# Patient Record
Sex: Male | Born: 1935 | Race: White | Hispanic: No | Marital: Married | Smoking: Never smoker
Health system: Southern US, Community
[De-identification: ages and names within clinical notes are randomized; demographics above are authoritative.]

## PROBLEM LIST (undated history)

## (undated) DIAGNOSIS — M1990 Unspecified osteoarthritis, unspecified site: Secondary | ICD-10-CM

## (undated) DIAGNOSIS — F0390 Unspecified dementia without behavioral disturbance: Secondary | ICD-10-CM

## (undated) DIAGNOSIS — F329 Major depressive disorder, single episode, unspecified: Secondary | ICD-10-CM

## (undated) DIAGNOSIS — F32A Depression, unspecified: Secondary | ICD-10-CM

## (undated) DIAGNOSIS — I10 Essential (primary) hypertension: Secondary | ICD-10-CM

## (undated) DIAGNOSIS — E785 Hyperlipidemia, unspecified: Secondary | ICD-10-CM

## (undated) DIAGNOSIS — M4800 Spinal stenosis, site unspecified: Secondary | ICD-10-CM

## (undated) HISTORY — PX: BACK SURGERY: SHX140

## (undated) HISTORY — PX: OTHER SURGICAL HISTORY: SHX169

---

## 2000-08-13 ENCOUNTER — Ambulatory Visit (HOSPITAL_COMMUNITY): Admission: RE | Admit: 2000-08-13 | Discharge: 2000-08-13 | Payer: Self-pay | Admitting: Family Medicine

## 2000-08-13 ENCOUNTER — Encounter: Payer: Self-pay | Admitting: Family Medicine

## 2000-11-24 ENCOUNTER — Encounter: Payer: Self-pay | Admitting: Neurosurgery

## 2000-11-24 ENCOUNTER — Ambulatory Visit: Admission: RE | Admit: 2000-11-24 | Discharge: 2000-11-25 | Payer: Self-pay | Admitting: Neurosurgery

## 2000-12-28 ENCOUNTER — Encounter: Payer: Self-pay | Admitting: Neurosurgery

## 2000-12-28 ENCOUNTER — Inpatient Hospital Stay (HOSPITAL_COMMUNITY): Admission: RE | Admit: 2000-12-28 | Discharge: 2000-12-31 | Payer: Self-pay | Admitting: Neurosurgery

## 2002-03-21 ENCOUNTER — Ambulatory Visit (HOSPITAL_COMMUNITY): Admission: RE | Admit: 2002-03-21 | Discharge: 2002-03-21 | Payer: Self-pay | Admitting: Gastroenterology

## 2002-03-21 ENCOUNTER — Encounter (INDEPENDENT_AMBULATORY_CARE_PROVIDER_SITE_OTHER): Payer: Self-pay | Admitting: Specialist

## 2004-09-17 ENCOUNTER — Ambulatory Visit: Payer: Self-pay | Admitting: Family Medicine

## 2004-12-23 ENCOUNTER — Ambulatory Visit: Payer: Self-pay | Admitting: Family Medicine

## 2005-04-28 ENCOUNTER — Ambulatory Visit: Payer: Self-pay | Admitting: Family Medicine

## 2005-09-01 ENCOUNTER — Ambulatory Visit: Payer: Self-pay | Admitting: Family Medicine

## 2005-12-31 ENCOUNTER — Ambulatory Visit: Payer: Self-pay | Admitting: Family Medicine

## 2006-05-10 ENCOUNTER — Ambulatory Visit: Payer: Self-pay | Admitting: Family Medicine

## 2007-01-19 ENCOUNTER — Ambulatory Visit: Payer: Self-pay | Admitting: Family Medicine

## 2007-12-02 ENCOUNTER — Ambulatory Visit (HOSPITAL_COMMUNITY): Admission: RE | Admit: 2007-12-02 | Discharge: 2007-12-02 | Payer: Self-pay | Admitting: Family Medicine

## 2009-09-12 ENCOUNTER — Encounter: Payer: Self-pay | Admitting: Emergency Medicine

## 2009-09-12 ENCOUNTER — Emergency Department (HOSPITAL_COMMUNITY): Admission: EM | Admit: 2009-09-12 | Discharge: 2009-09-12 | Payer: Self-pay | Admitting: Emergency Medicine

## 2009-09-12 ENCOUNTER — Ambulatory Visit: Payer: Self-pay | Admitting: Radiology

## 2011-01-30 NOTE — H&P (Signed)
[place]. [facility]  Patient:    Joshua Bright, Joshua Bright                    MRN: [id number] Adm. Date:  [id number] Attending:  Josie Saunders                         History and Physical  REASON FOR ADMISSION:  Lumbar spinal stenosis.  HISTORY OF ILLNESS:  Joshua Bright is a 77 year old right-handed former Programmer, systems and business person who presented at the request of Dr. Christell Constant for neurosurgical consultation regarding lumbar spinal stenosis.  I initially saw him on September 29, 2000.  Joshua Bright complains currently of hip pain into his right hip.  He also notes that he has lost significant muscle into his calf on the right.  He says he can only walk for up to a quarter of a mile and then he develops severe hip pain and his legs go out on him.  He says that this occurs when he stands for 10 to 15 minutes at a time.  He notes tightness in his left leg and says this has been going on for the last month.  He says he has pain in his right leg and atrophy of his calf over the last five to six years.  He denies significant low back pain.  He denies bowel or bladder dysfunction other than increased frequency of urination and he does note difficulty having and maintaining erections.  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  He retired in 1985.  He says he was extremely active and he exercises daily, seven days a week; he rides a bicycle and cuts wood.  He says he had a stress test 10 years ago and that several years ago, he had his cholesterol checked and this was okay.  He usually does not see a doctor.  He did see Dr. Julieanne Manson at the time of his stress test.  He attributes his health to mainly a vegetarian diet with plenty of exercise.  He notes that he was hit by a falling tree in 1980; it cracked a vertebra in his upper back. Other than that, he has had no problems with his back.  Joshua Bright presents with an MRI  of the lumbar spine which was obtained on August 13, 2000 which shows critical spinal stenosis at L4-5 secondary to posterior element hypertrophy and central herniated disk.  The thecal sac could not be observed due to severe compression.  There is similar but not quite as severe stenosis at L2-3 secondary to posterior element hypertrophy and central herniated disk.  There is similar but less severe stenosis at L3-4 and a central herniated disk at L5-S1 eccentric to the left.  REVIEW OF SYSTEMS:  Detailed review of systems sheet was reviewed with the patient.  Pertinent positives include:  EYES:  He wears glasses.  EARS, NOSE, MOUTH AND THROAT:  He notes hearing loss.  CARDIOVASCULAR:  He notes high blood pressure.  MUSCULOSKELETAL:  He notes back pain and leg pain.  All other systems negative.  PAST MEDICAL HISTORY:  Current medical conditions:  He notes high blood pressure, otherwise, no significant problems.  Prior operations and hospitalizations:  He said he was treated for a cracked vertebra in the 1970s when he was hit by a tree and hospitalized for eight days.  He had nasal polyps removed in the  early 52s.  He had a biopsy of his prostate gland by Dr. Maretta Bees. Joshua Bright which was negative.  CURRENT MEDICATIONS: 1. Zestril 20 mg daily. 2. Triamterene/hydrochlorothiazide 37.5/25 mg once daily.  ALLERGIES:  He denies any drug allergies.  HEIGHT AND WEIGHT:  Joshua Bright is 5-feet 8-inches tall, 160 pounds.  FAMILY HISTORY:  Mother died at age 86.  Father died at age 53 of heart disease.  SOCIAL HISTORY:  He is a nonsmoker, nondrinker of alcoholic beverages.  There is no history of substance abuse.  DIAGNOSTIC STUDIES:  As above.  PHYSICAL EXAMINATION:  GENERAL APPEARANCE:  On examination today, Joshua Bright is a pleasant, cooperative, fit-appearing older white male in no acute distress.  VITAL SIGNS:  Blood pressure and pulse 130/80, left arm seated, pulse is 80 and  irregular with frequent PVCs.  HEENT:  Normocephalic, atraumatic.  The pupils are equal, round and reactive to light.  Extraocular movements are intact.  Sclerae are white.  Conjunctivae pink.  Oropharynx benign.  Uvula midline.  NECK:  There are no masses, meningismus, deformities, tracheal deviation, jugular venous distention or carotid bruits.  There is normal cervical range of motion.  Spurlings test is negative without reproducible radicular pain turning the patients head to either side.  Lhermittes sign is not present with axial compression.  RESPIRATORY:  There is normal respiratory effort with good intercostal function.  Lungs are clear to auscultation.  There are no rales, rhonchi or wheezes.  CARDIOVASCULAR:  The heart has a regular rate and rhythm to auscultation.  No murmurs are appreciated.  There is no extremity edema, clubbing or cyanosis. There are palpable pedal pulses.  He does have frequent PVCs noted on his examination.  ABDOMEN:  Abdomen soft, nontender.  No hepatosplenomegaly appreciated or masses.  There are active bowel sounds.  No guarding or rebound.  MUSCULOSKELETAL:  He limps on the right because of his calf atrophy on the right.  He is able to bend to within four inches of the floor.  He cannot stand on his toes on the right but he can stand on his heels in both feet.  He has significant right-sided sciatic notch discomfort to palpation.  Straight leg raise is negative.  NEUROLOGIC:  The patient is oriented to time, person and place and has good recall of both recent and remote memory with normal attention span and concentration.  The patients speaks with clear and fluent speech and exhibits normal language function and appropriate fund of knowledge.  Cranial nerve examination:  Pupils are equal, round and reactive to light.  Extraocular movements are full.  Visual fields are full to confrontational testing. Facial sensation and facial motor are intact  and symmetric.  Hearing is intact to finger rub.  Palate is upgoing.  Shoulder shrug is symmetric.  Tongue  protrudes in the midline.  Motor examination:  Motor strength is 5/5 in bilateral deltoids, biceps, triceps, hand grips, wrist extensors and interossei.  In the lower extremities, motor strength is 5/5 in hip flexion, extension, quadriceps, hamstrings, dorsiflexion, extensor hallucis longi and left plantarflexion; he has marked weakness in his right plantarflexion. Sensory examination normal to light touch and pinprick sensation in the upper and lower extremities.  Deep tendon reflexes are 1 in the biceps, triceps and brachioradialis; 2 at the right knee; 3 at the left knee; absent at both ankles.  Great toes are downgoing to plantar stimulation.  He has a positive Hoffmanns sign on the right and plus/minus on the  left.  Cerebellar examination:  Normal coordination in the upper and lower extremities and normal rapid alternating movements.  Rombergs test is negative.  IMPRESSION AND RECOMMENDATIONS:  Joshua Bright is a 77 year old man with significant right calf atrophy and marked spinal stenosis most marked at the L4-5 level, but to a lesser degree at L2-3 and L3-4 levels.  I recommended that he undergo decompressive lumbar laminectomy for spinal stenosis.  This will be performed at L2 through L5 levels but I was concerned about the atrophy of his gastrocnemius and told him I did not think this would come back.  He understands that the purpose of the surgery will be to relieve pain and also to prevent further decline in function, but he may not get his strength back.  I was concerned on this examination of frequent premature ventricular contractions and wanted to make sure there is not a problem before embarking on an elective surgical procedure.  He is going to go back to see Dr. Clarene Duke who he previously saw and upon clear etiology and clearance, we will go ahead and set him up  for surgery.  I reviewed the studies with the patient and went over his physical examination.  I reviewed surgical models and discussed typical hospital course and operative and postoperative course and the potential risks and benefits of surgery.  The risks of surgery were discussed in detail and included, but are not limited to, the risks of anesthesia, blood loss, the possible of hemorrhage, infection, damage to nerves, damage to blood vessels, injury to the lumbar nerve root causing either temporary or permanent leg pain, numbness or weakness.  There is a potential for a spinal fluid leak from dural tear. There is a potential for post-laminectomy spondylolisthesis, recurrent disk herniation quoted at approximately 10%, failure to relieve pain, worsening of pain and need for further surgery.DD:  12/28/00 TD:  12/28/00 Job: [id number]

## 2011-01-30 NOTE — Op Note (Signed)
[place]. [facility]  Patient:    Joshua Bright, Joshua Bright                    MRN: [id number] Proc. Date: 12/28/00 Adm. Date:  [id number] Attending:  Josie Saunders                           Operative Report  PREOPERATIVE DIAGNOSIS:  Severe spinal stenosis L2-3, L3-4, L4-5, and L5-S1, with spondylosis, degenerative disk disease, and lumbar radiculopathy.  POSTOPERATIVE DIAGNOSIS:  Severe spinal stenosis L2-3, L3-4, L4-5, and L5-S1, with spondylosis, degenerative disk disease, and lumbar radiculopathy.  PROCEDURE:  Decompressive lumbar laminectomy L2 through S1 with microdissection.  SURGEON:  Danae Orleans. Venetia Maxon, M.D.  ASSISTANT:  Tanya Nones. Jeral Fruit, M.D.  ANESTHESIA:  General endotracheal anesthesia.  ESTIMATED BLOOD LOSS:  300 cc.  COMPLICATIONS:  Small durotomy, repaired primarily.  DISPOSITION:  To recovery.  INDICATION:  Joshua Bright is a 77 year old man with marked spinal stenosis and critical stenosis at the L4-5 level and severe stenosis at L2-3, L3-4, and the L5-S1 level.  It was elected to take him to surgery for decompressive lumbar laminectomy.  DESCRIPTION OF PROCEDURE:  Joshua Bright was brought to the operating room. Following the satisfactory and uncomplicated induction of general endotracheal anesthesia and placement of intravenous lines and Foley catheter, he was placed in a prone position on the Wilson frame.  His low back was then prepped and draped in the usual sterile fashion.  The area of planned incision was infiltrated with 0.25% Marcaine and 0.5% lidocaine with 1:200,000 epinephrine. Incision was made in the midline overlying the spinous processes from the L2 to sacral levels, carried sharply through subcutaneous fat to the lumbodorsal fascia, which was then incised with electrocautery.  Subperiosteal dissection was performed, exposing the L2 through sacral laminae bilaterally. Self-retaining _____ retractor was placed.   Intraoperative x-ray confirmed orientation.  The spinous processes of L2, L3, L4, and L5 were removed.  The laminae of L2 through L5 were then thinned with Leksell rongeur.  The bone was quite soft.  The spinal canal was then decompressed with a variety of Kerrison rongeurs.  The dura was quite thin and adherent to the hypertrophied ligamentum flavum.  This was particularly marked at the L4-5 level, where there was near-complete block of the spinal canal.  On decompressing the spinal canal on the right side at the L4-5 level, a small midline durotomy was created.  This was subsequently repaired under the microscope with interrupted 6-0 Prolene sutures with no evidence of residual egress of CSF.  Both the lateral recesses were decompressed from L2 to the sacral levels bilaterally. There was significant hypertrophy of the ligamentum flavum, and this was quite densely adherent to the dura and the lateral recesses.  No other areas of durotomy were created.  Following completion of decompression, there was excellent hemostasis.  The [facility] elevator was easily passed in the neural foramina at each of the decompressed levels.  The Tisseel was then applied to the area of dural opening.  The self-retaining retractor was removed.  The deep muscular layers were reapproximated with 1 Vicryl sutures, the lumbodorsal fascia was reapproximated with 1 Vicryl sutures, subcutaneous tissue was reapproximated with 2-0 Vicryl interrupted inverted sutures, and the skin edges were reapproximated with a running 3-0 nylon stitch.  The wound was dressed with bacitracin and Telfa gauze tape.  The patient was extubated in the operating room, having  tolerated the operation well without difficulty. DD:  12/28/00 TD:  12/29/00 Job: [id number]

## 2011-01-30 NOTE — Procedures (Signed)
[facility]  Patient:    Joshua Bright, Joshua Bright Visit Number: [id number] MRN: [id number]          Service Type: END Location: ENDO Attending Physician:  Louie Bun Dictated by:   Everardo All Madilyn Fireman, M.D. Proc. Date: 03/21/02 Admit Date:  03/21/2002 Discharge Date: 03/21/2002   CC:         Delaney Meigs, M.D.   Procedure Report  PROCEDURE:  Colonoscopy with polypectomy.  INDICATION FOR PROCEDURE:  Intermittent rectal bleeding and a family history of colon cancer in a first degree relative.  DESCRIPTION OF PROCEDURE:  The patient was placed in the left lateral decubitus position then placed on the pulse monitor with continuous low flow oxygen delivered by nasal cannula. He was sedated with 70 mg IV Demerol and 5 mg IV Versed. The Olympus video colonoscope was inserted into the rectum and advanced to the cecum, confirmed by transillumination at McBurneys point and visualization of the ileocecal valve and appendiceal orifice. The prep was good. The cecum and ascending colon appeared normal with no polyps, diverticula or other mucosal abnormalities. Within the transverse colon, there was an 8 mm polyp which was fulgurated by hot biopsy. The transverse colon appeared normal. In the sigmoid colon, there were multiple diverticula. The rectum appeared normal and retroflexed view of the anus revealed no obvious internal hemorrhoids. The colonoscope was then withdrawn and the patient returned to the recovery room in stable condition. The patient tolerated the procedure well and there were no immediate complications.  IMPRESSION: 1. An 8 mm transverse colon polyp hot biopsied. 2. Diverticulosis.  PLAN:  Await histology for determination of the method and interval for next colonoscopy. Dictated by:   Everardo All Madilyn Fireman, M.D. Attending Physician:  Louie Bun DD:  03/21/02 TD:  03/24/02 Job: [id number]

## 2011-01-30 NOTE — Discharge Summary (Signed)
[place]. [facility]  Patient:    Joshua Bright, Joshua Bright                    MRN: [id number] Adm. Date:  [id number] Disc. Date: [id number] Attending:  Josie Saunders                           Discharge Summary  ADMISSION DIAGNOSIS:  Lumbosacral spondylosis with degenerative disc disease and spinal stenosis.  DISCHARGE DIAGNOSES: 1. Lumbosacral spondylosis with degenerative disc disease and spinal stenosis. 2. Urinary retention. 3. Premature ventricular contractions. 4. Hypertension. 5. Old myocardial infarction.  HISTORY OF PRESENT ILLNESS:  Joshua Bright is a 77 year old man with a severe spinal stenosis.  He additionally has hypertension for which he takes Zestril 20 mg daily and triamterene/hydrochlorothiazide 37.5/25 once daily.  HOSPITAL COURSE:  The patient was admitted to the hospital and underwent decompressive lumbar laminectomy at L2 through L5 levels.  The patient intraoperatively had a small durotomy which was repaired primarily.  He did well following his surgery.  He was up and ambulating on April 19, and was doing well at that point with discharge medications of Percocet and followup in 10 days for suture removal.  He had an episode of urinary retention which required Foley catheter drainage which was then removed on the April 19, and was voiding well.  CONDITION ON DISCHARGE:  Discharged in stable and satisfactory condition.  He tolerated his operation and hospitalization well.  DISCHARGE MEDICATIONS: 1. Baseline preoperative medications. 2. Percocet 5/325 one to two every four hours as needed for pain.  ACTIVITY:  No lifting, bending, twisting or driving.  SPECIAL INSTRUCTIONS:  Okay to shower, not to soak his incision.  FOLLOWUP:  Follow up in 10 days for suture removal. DD:  01/14/01 TD:  01/16/01 Job: [id number]

## 2011-07-08 ENCOUNTER — Emergency Department (HOSPITAL_COMMUNITY): Payer: Medicare Other

## 2011-07-08 ENCOUNTER — Inpatient Hospital Stay (HOSPITAL_COMMUNITY)
Admission: EM | Admit: 2011-07-08 | Discharge: 2011-07-13 | DRG: 482 | Disposition: A | Discharge status: Home Health | Payer: Medicare Other | Attending: Orthopedic Surgery | Admitting: Orthopedic Surgery

## 2011-07-08 DIAGNOSIS — Z79899 Other long term (current) drug therapy: Secondary | ICD-10-CM

## 2011-07-08 DIAGNOSIS — W64XXXA Exposure to other animate mechanical forces, initial encounter: Secondary | ICD-10-CM | POA: Diagnosis present

## 2011-07-08 DIAGNOSIS — E785 Hyperlipidemia, unspecified: Secondary | ICD-10-CM | POA: Diagnosis present

## 2011-07-08 DIAGNOSIS — I10 Essential (primary) hypertension: Secondary | ICD-10-CM | POA: Diagnosis present

## 2011-07-08 DIAGNOSIS — Z66 Do not resuscitate: Secondary | ICD-10-CM | POA: Diagnosis present

## 2011-07-08 DIAGNOSIS — Y92009 Unspecified place in unspecified non-institutional (private) residence as the place of occurrence of the external cause: Secondary | ICD-10-CM

## 2011-07-08 DIAGNOSIS — Y93K9 Activity, other involving animal care: Secondary | ICD-10-CM

## 2011-07-08 DIAGNOSIS — S72143A Displaced intertrochanteric fracture of unspecified femur, initial encounter for closed fracture: Principal | ICD-10-CM | POA: Diagnosis present

## 2011-07-08 DIAGNOSIS — Z7982 Long term (current) use of aspirin: Secondary | ICD-10-CM

## 2011-07-08 DIAGNOSIS — I252 Old myocardial infarction: Secondary | ICD-10-CM

## 2011-07-08 LAB — BASIC METABOLIC PANEL
BUN: 18 mg/dL (ref 6–23)
CO2: 28 mEq/L (ref 19–32)
Chloride: 105 mEq/L (ref 96–112)
Glucose, Bld: 141 mg/dL — ABNORMAL HIGH (ref 70–99)
Potassium: 3.6 mEq/L (ref 3.5–5.1)
Sodium: 142 mEq/L (ref 135–145)

## 2011-07-08 LAB — DIFFERENTIAL
Basophils Relative: 0 % (ref 0–1)
Lymphs Abs: 0.9 10*3/uL (ref 0.7–4.0)
Monocytes Absolute: 0.6 10*3/uL (ref 0.1–1.0)
Monocytes Relative: 6 % (ref 3–12)
Neutro Abs: 9 10*3/uL — ABNORMAL HIGH (ref 1.7–7.7)

## 2011-07-08 LAB — CBC
HCT: 38.6 % — ABNORMAL LOW (ref 39.0–52.0)
Hemoglobin: 13.5 g/dL (ref 13.0–17.0)
MCH: 30.1 pg (ref 26.0–34.0)
MCHC: 35 g/dL (ref 30.0–36.0)
MCV: 86.2 fL (ref 78.0–100.0)
RBC: 4.48 MIL/uL (ref 4.22–5.81)

## 2011-07-09 ENCOUNTER — Inpatient Hospital Stay (HOSPITAL_COMMUNITY): Payer: Medicare Other

## 2011-07-09 LAB — SURGICAL PCR SCREEN: Staphylococcus aureus: NEGATIVE

## 2011-07-10 LAB — BASIC METABOLIC PANEL
BUN: 15 mg/dL (ref 6–23)
CO2: 29 mEq/L (ref 19–32)
Chloride: 101 mEq/L (ref 96–112)
Creatinine, Ser: 0.96 mg/dL (ref 0.50–1.35)
GFR calc Af Amer: 87 mL/min — ABNORMAL LOW (ref >90)
Potassium: 4.4 mEq/L (ref 3.5–5.1)

## 2011-07-10 LAB — CBC
HCT: 33 % — ABNORMAL LOW (ref 39.0–52.0)
MCV: 87.8 fL (ref 78.0–100.0)
RBC: 3.76 MIL/uL — ABNORMAL LOW (ref 4.22–5.81)
RDW: 12.5 % (ref 11.5–15.5)
WBC: 7.8 10*3/uL (ref 4.0–10.5)

## 2011-07-10 NOTE — Consult Note (Signed)
Joshua Bright, WALCK NO.:  [id number]  MEDICAL RECORD NO.:  [id number]  LOCATION:  [facility]                         FACILITY:  [facility]  PHYSICIAN:  Brendia Sacks, MD    DATE OF BIRTH:  [birth year]  DATE OF CONSULTATION:  07/08/2011 DATE OF DISCHARGE:                                CONSULTATION   REQUESTING PHYSICIAN:  Margarita Grizzle, MD  ADMITTING PHYSICIAN:  Dahari D. Shon Baton, MD  REASON FOR CONSULTATION:  The patient with history of hip fracture, hypertension, hyperlipidemia.  HISTORY OF PRESENT ILLNESS:  This is an 77 year old man who presents to the emergency room today with a history of fall.  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  This bull knocked him backwards.  He fell on his right hip and had immediate pain.  He was able to drag himself a few feet under the fence to get away from the cows.  He called for help and was brought to the emergency room where he was found to have a hip fracture.  The patient is a very active man.  He bikes approximately 6 miles every other day and also walks a mile and half on a treadmill at least several days a week.  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  He remains quite active and he has had no recent chest pain or shortness of breath.  He has no history of cardiac disease that he is aware of.  He was told sometime in the past that he might have had a myocardial infarction, but he is really unaware of this.  By report, he had his stress test approximately 7 years ago and an echocardiogram, but neither of these are in the system but reportedly unremarkable.  He has had no cardiac symptoms.  Review of systems is negative for fever, changes to his vision.  He did have a sore throat a few days ago, but it is much better.  No rash.  No chest pain.  No shortness of breath.  No nausea, vomiting, abdominal pain, diarrhea, dysuria, or  bleeding.  PAST MEDICAL HISTORY: 1. Hypertension. 2. Hyperlipidemia. 3. Denies history of coronary artery disease.  See above HPI. 4. Denies history of diabetes or stroke.  PAST SURGICAL HISTORY: 1. Lumbar back surgery approximately 7 years ago by Dr. Venetia Maxon. 2. Finger surgery by Dr. Noelle Penner secondary to a crush injury.  SOCIAL HISTORY:  Nonsmoker.  Nondrinker.  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  ALLERGIES:  None.  FAMILY HISTORY:  Mother had heart disease.  Father died at approximately age 33.  Two brothers had open heart surgery.  MEDICATIONS: 1. Lipitor 20 mg p.o. at bedtime. 2. Micardis 80 mg/12.5 mg hydrochlorothiazide p.o. daily. 3. Baby aspirin p.o. at bedtime.  PHYSICAL EXAMINATION:  GENERAL:  Joshua Bright is examined in the emergency room.  He is lying flat on a stretcher, appears to be calm and comfortable.  A well-developed man. VITAL SIGNS:  Blood pressure 156/99, pulse 82, respirations 19, temperature 98.2, sat 97%. HEENT:  Head appears to be normal.  Eyes, sclerae are  clear.  Pupils appear unremarkable.  Lids and conjunctivae appear unremarkable.  ENT, he is slightly hard-of-hearing.  Lips and tongue appear to be grossly normal. NECK:  Supple.  No lymphadenopathy or masses.  No thyromegaly. CHEST:  Clear to auscultation bilaterally.  No wheezes, rales, or rhonchi.  There is normal respiratory effort. CARDIOVASCULAR:  Regular rate and rhythm.  No rub or gallop.  No lower extremity edema.  Possible 1/6 systolic murmur. ABDOMEN:  Soft, nontender, nondistended.  No masses are appreciated. SKIN:  Normal without rash or indurations.  Nontender to palpation. MUSCULOSKELETAL:  Notable for an externally rotated and shortened right leg. PSYCHIATRIC:  Grossly normal mood and affect.  Speech is fluent and appropriate.  ANCILLARY STUDIES:  EKG is independently reviewed and shows normal sinus rhythm with first-degree AV block.  Inferior infarct,  age unknown.  No previous studies available for comparison.  IMPRESSION AND RECOMMENDATIONS:  This is an 77 year old man who sustained a right hip fracture after being knocked down by a bull today. 1. Fall with right hip fracture.  Deferred to Dr. Shon Baton. 2. History of hypertension and hyperlipidemia.  We would continue his     Lipitor, Micardis, and hydrochlorothiazide as well as his baby     aspirin.  Of note, the patient is a fairly robust 77 year old man     who is very physically active and has had no previous symptoms     referable to cardiac disease; therefore, I would proceed with     surgery without further evaluation. 3. Code status.  The patient tells me that he is DNR and he tells me     this in the presence of his family, who appear to agree.  Team 1 will be following up on this patient in the morning.  Thank you for this consultation.     Brendia Sacks, MD     DG/MEDQ  D:  07/08/2011  T:  07/08/2011  Job:  [id number]  cc:   Alvy Beal, MD  Electronically Signed by Brendia Sacks  on 07/10/2011 03:15:52 PM

## 2011-07-11 LAB — BASIC METABOLIC PANEL
BUN: 17 mg/dL (ref 6–23)
Chloride: 96 mEq/L (ref 96–112)
Creatinine, Ser: 0.93 mg/dL (ref 0.50–1.35)
Glucose, Bld: 126 mg/dL — ABNORMAL HIGH (ref 70–99)
Potassium: 3.3 mEq/L — ABNORMAL LOW (ref 3.5–5.1)

## 2011-07-11 LAB — CBC
HCT: 30.7 % — ABNORMAL LOW (ref 39.0–52.0)
Hemoglobin: 10.5 g/dL — ABNORMAL LOW (ref 13.0–17.0)
MCHC: 34.2 g/dL (ref 30.0–36.0)
MCV: 86.2 fL (ref 78.0–100.0)
RDW: 12.1 % (ref 11.5–15.5)
WBC: 8.8 10*3/uL (ref 4.0–10.5)

## 2011-07-12 LAB — URINALYSIS, ROUTINE W REFLEX MICROSCOPIC
Bilirubin Urine: NEGATIVE
Ketones, ur: NEGATIVE mg/dL
Specific Gravity, Urine: 1.013 (ref 1.005–1.030)
pH: 6.5 (ref 5.0–8.0)

## 2011-07-12 LAB — URINE MICROSCOPIC-ADD ON

## 2011-07-21 NOTE — Op Note (Signed)
NAMEDAKAI, Joshua Bright             ACCOUNT NO.:  [id number]  MEDICAL RECORD NO.:  [id number]  LOCATION:                                 FACILITY:  PHYSICIAN:  Alvy Beal, MD    DATE OF BIRTH:  01-12-1927  DATE OF PROCEDURE:  07/09/2011 DATE OF DISCHARGE:                              OPERATIVE REPORT   ADMITTING DIAGNOSIS:  Right intertrochanteric hip fracture.  SURGEON:  Alvy Beal, MD  FIRST ASSISTANT:  [name], PA, who is my PA.  INSTRUMENTATION SYSTEM USED:  Synthes troch nail - it was 11 x 340, locked proximally with a 105 lag screw, no distal locking screw.  COMPLICATIONS:  None.  Minimal blood loss.  HISTORY:  This is a very pleasant 77 year old otherwise healthy gentleman who was [place].  The patient was ultimately brought to the emergency room with complaints of significant right hip pain and inability to ambulate.  Orthopedic consultation was requested.  X-rays demonstrated the hip fracture.  After discussing all risks, benefits, and alternatives of surgery, he elected to proceed with surgical procedure.  OPERATIVE NOTE:  The patient was brought to the operating room, placed supine on the operating table.  After successful induction of general anesthesia, endotracheal intubation, the patient was moved onto a fracture bed.  The left lower extremity was placed in the well leg holder in a flexed and abducted position.  The right leg which had been marked in the holding area by myself was placed into traction, the arms were secured.  A gentle reduction maneuver was carried out.  X-rays confirmed satisfactory fracture reduction.  Time-out was then done to confirm patient, procedure, affected extremity, and all other pertinent important data.  Once this was completed, then I prepped and draped the right hip.  A lateral incision was made starting at the tip of the greater trochanter and proceeding proximally.  I then advanced the  guide pin to the tip of the troch and then I advanced the guide pin across the fracture site just below the lesser troch.  I then used the all in 1 step drill, drilled over the guide pin.  With the proximal reaming done, I measured and elected to use a 340 rod with the tibial nail.  This would provide long adequate fit and stabilization.  The nail was then malleted down to the appropriate position.  I then made a 2nd incision on the lateral side of the femur, placed the guide trocar for the lag screw.  I then placed a guide pin into the femoral neck and head, and confirmed satisfactory position in both the AP and lateral planes.  I then drilled and then placed a 100-mm lag screw.  It was adequately fracture.  At this point, with a stable fixation, I irrigated both wounds, closed the deep fascia with interrupted 0 Vicryl suture, superficial 2-0 Vicryl sutures, and 3-0 Monocryl for the skin.  Steri-Strips, dry dressings were applied.  The patient was extubated, transferred to PACU without incident.  My 1st assistant was [name], she was instrumental in helping with the traction and suturing and wound closure.  Alvy Beal, MD     DDB/MEDQ  D:  07/09/2011  T:  07/10/2011  Job:  [id number]  Electronically Signed by Venita Lick MD on 07/21/2011 02:57:41 PM

## 2011-07-23 NOTE — Discharge Summary (Deleted)
NAME:  Joshua Bright, Joshua Bright             ACCOUNT NO.:  [id number]  MEDICAL RECORD NO.:  [id number]  LOCATION:  5029                         FACILITY:  [facility]  PHYSICIAN:  Dahari D Brooks, MD    DATE OF BIRTH:  09/21/1926  DATE OF ADMISSION:  07/08/2011 DATE OF DISCHARGE:  07/13/2011                              DISCHARGE SUMMARY   ADMITTING DIAGNOSES: 1. Right intertrochanteric hip fracture. 2. Hypertension. 3. Hyperlipidemia.  DISCHARGE DIAGNOSES: 1. Right intertrochanteric hip fracture status post intramedullary     nail, right hip. 2. Hypertension 3. Hyperlipidemia.  SURGEON:  Dahari D Brooks, MD  ASSISTANT:  Diana  Kovach, PA-C  COMPLICATIONS:  None.  CONSULTS:  None.  BRIEF HISTORY:  Joshua Bright is an 77-year-old man who presented to the emergency room on July 08, 2011 with a history of a fall.  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  He fell on his right hip and had immediate pain.  He was able to drag himself to safety under the fence.  After being evaluated in the emergency room upon arrival, Dr. Brooks, orthopedic surgeon on call was consulted for further evaluation and management.  On x-ray by report, he was found to have a mildly displaced intertrochanteric femur fracture with mild varus angulation.  Dr. Brooks discussed the risks, benefits, and alternatives of surgery with the patient.  Joshua Bright elected to proceed with the surgical procedure.  HOSPITAL COURSE:  On July 08, 2011, the patient was admitted to [facility] with the diagnosis of a right intertrochanteric hip fracture.  On July 09, 2011, the patient underwent the above- mentioned procedure without complication.  In stable condition he was transferred to the PACU and subsequently to the orthopedic floor.  On postop day #1, the patient was doing well.  He was afebrile with stable vital signs.  His dressings were clean, dry, and  intact.  There was no shortness of breath or chest pain.  There was positive flatus. His compartments were soft and nontender.  His neurovascular status in the bilateral lower extremities were intact.  He would continue mobilization with physical therapy.  Labs were drawn.  On postop day #2, the patient reported difficulty voiding.  He was otherwise doing well. His vital signs were stable.  His clinical exam was unchanged.  An order was placed for in and out cath p.r.n.  Please see the intake and output detail report for the specifics.  On postop day #3, the patient was doing well from the hip standpoint. He was tolerating therapy.  Tolerating a regular diet.  His pain was well controlled with oral pain medications.  His major complaint at that time was still difficulty voiding.  He had a bowel movement.  By nurse's report, his last in and out at 6:45 a.m. was 700 mL.  There was no shortness of breath.  There was no chest pain.  He was alert and oriented x3.  No acute distress.  His vital signs were stable and he was afebrile.  The abdomen was soft and nontender. Neurovascular status in the bilateral lower extremities were intact. His compartments were   soft and nontender.  His wound remained clean, dry, and intact.  After discussing treatment options with the patient regarding his inability to void on his own, it was determined that he would go home with a indwelling catheter and follow up with [facility] in 1 week.  DISCHARGE MEDICATIONS: 1. Percocet 5/325. 2. Robaxin 500 mg. 3. Zofran 4 mg. 4. MiraLax. 5. Micardis 80/12.5 mg. 6. Lipitor 20 mg.  DISCHARGE INSTRUCTIONS: 1. Activity.  Walk with assistance with the use of a rolling walker.     May shower.  Must pack wound dry. 2. Wound care.  Keep dressing clean and dry. 3. Followup appointment:     a.     Dr. Brooks.  Follow up in 2 weeks.     b.     Dr. Eskridge, [facility].  Follow up in 1 week.  DISCHARGE PLAN:   The patient was discharged to home with the assistance of [facility] on July 13, 2011.  Appropriate preprinted discharge instructions were provided to the patient at the time of discharge.  All of his questions were encouraged, addressed, and answered.  He was discharged from the hospital on October, 29, 2012 in stable condition.    ______________________________ Diana Kovach, PA   ______________________________ Dahari D Brooks, MD    DK/MEDQ  D:  07/23/2011  T:  07/23/2011  Job:  [id number] 

## 2011-07-26 NOTE — H&P (Signed)
NAMEJAYZEN, Joshua Bright NO.:  [id number]  MEDICAL RECORD NO.:  [id number]  LOCATION:  5029                         FACILITY:  [facility]  PHYSICIAN:  Alvy Beal, MD    DATE OF BIRTH:  1926-12-20  DATE OF ADMISSION:  07/08/2011 DATE OF DISCHARGE:                             HISTORY & PHYSICAL   ATTENDING PHYSICIAN:  Dr. Venita Lick.  ADMITTING DIAGNOSIS:  Right intertrochanteric fracture status post fall.  HISTORY OF PRESENT ILLNESS:  Joshua Bright is an 77 year old man who presented to [facility] Emergency Room with a history of a fall.  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  He fell on his right hip and had immediate pain.  He was able to drive himself to safety away from the cattle.  He called for help and was brought to the emergency room where he was found to have a right hip fracture.  Dr. Venita Lick was consulted for further evaluation and management.  ALLERGIES:  None.  PAST MEDICAL HISTORY: 1. Hypertension. 2. Hyperlipidemia 3. Questionable episode of previous MI.  PAST SURGICAL HISTORY: 1. Lumbar back surgery approximately 7 years ago by Dr. Venetia Maxon. 2. Finger surgery by Dr. Noelle Penner secondary to a crush injury.  SOCIAL HISTORY:  Joshua Bright is a nonsmoker and a nondrinker.  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  At 25 years old, he remains very active.  He bikes approximately 6 mile every other day and also walks a mile and a half on the treadmill at least several times a week.  FAMILY HISTORY:  Significant for heart disease in his mother.  His father died at approximately age 35.  He has 2 siblings, both which had open heart surgery.  REVIEW OF SYSTEMS:  Negative for fever, chills, shortness of breath, or chest pain.  He does note a sore throat today.  No rashes, no nausea, vomiting, abdominal pain, diarrhea, dysuria, or bleeding.  He denies a history  of diabetes or stroke.  MEDICATIONS: 1. Lipitor 20 mg p.o. at bedtime. 2. Micardis 80 mg/12.5 mg. 3. Hydrochlorothiazide p.o. daily. 4. Baby aspirin p.o. at bedtime. 5. Chondroitin supplement daily.  PHYSICAL EXAMINATION:  GENERAL:  He is a well-developed, well-nourished, 77 year old gentleman in no acute distress.  He is alert and oriented x3. VITAL SIGNS:  Temperature is 98.4, pulse is 74, respirations 20, BP 155/71. ABDOMEN:  Soft and nontender and nondistended.  There are no masses appreciated.  MUSCULOSKELETAL:  Limited as the patient's right leg is currently in traction.  His neurovascular status is intact in the bilateral lower extremities.  He can wiggle his toes on both his right and left foot.  Pulses are equal and symmetric, bilateral lower extremities.  X-rays of the right hip by report demonstrate a right intertrochanteric femur fracture.  PLAN:  Joshua Bright is an 77 year old man who sustained a right hip fracture after being knocked down by a bull yesterday.  PLAN:  At this time is to proceed with surgery.  Risks and benefits have been reviewed with the patient and his wife by Dr. Shon Baton.  All  of their questions were encouraged, addressed, and answered.  We will continue his home medications for his hypertension and his high blood pressure.  The patient reports a code status of DNR.    ______________________________ [name], PA   ______________________________ Alvy Beal, MD    DK/MEDQ  D:  07/09/2011  T:  07/09/2011  Job:  [id number]  Electronically Signed by [name] PA on 07/23/2011 06:48:15 AM Electronically Signed by Venita Lick MD on 07/26/2011 12:49:23 PM

## 2011-08-05 NOTE — Discharge Summary (Deleted)
NAME:  Joshua Bright, Joshua Bright             ACCOUNT NO.:  [id number]  MEDICAL RECORD NO.:  [id number]  LOCATION:  5029                         FACILITY:  [facility]  PHYSICIAN:  Joshua D Brooks, MD    DATE OF BIRTH:  09/07/1927  DATE OF ADMISSION:  07/08/2011 DATE OF DISCHARGE:  07/13/2011                              DISCHARGE SUMMARY   ADMITTING DIAGNOSES: 1. Right intertrochanteric fracture, status post fall. 2. Hypertension. 3. Hyperlipidemia.  DISCHARGE DIAGNOSES: 1. Right intertrochanteric fracture, status post right intramedullary     nail. 2. Hypertension. 3. Hyperlipidemia.  SURGEON:  Joshua D Brooks, MD  ASSISTANT:  Joshua Kovach, PA-C  PROCEDURE:  IM nail, right hip.  Please see the dictated OP note for the specifics.  COMPLICATIONS:  None.  CONSULTS:  None.  BRIEF HISTORY:  Joshua Bright is an 77-year-old man who presented to the [facility] Emergency Room with a history of a fall.  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  He fell on his right hip and had immediate pain.  He is able to drag himself to safety away from the cattle.  He called for help and was brought to the emergency room where he was found to have a right hip fracture.  Dr. Brooks, orthopedic surgeon on-call was consulted for further evaluation and management.  X-rays of the right hip by report demonstrated a right intertrochanteric femur fracture.  Risks and benefits were reviewed by Dr. Brooks with the patient and his wife.  All of their questions were encouraged, addressed and answered. They elected to proceed with surgery.  HOSPITAL COURSE:  On July 09, 2011, the patient was brought to the Operating Room at [facility].  He underwent the above-stated procedure without complication.  In stable condition, he was transferred to the PACU and subsequently to the Orthopedic floor.  On postop day #1, the patient was doing well.  He was  afebrile with stable vital signs.  His wounds were clean, dry, and intact.  There was no shortness of breath or chest pain.  There was positive flatus.  His compartments remained soft and nontender.  He was neurovascularly intact.  Labs were ordered.  He would continue mobilization with physical therapy.  He would continue with his home dose of his blood pressure medications.  On postop day #2, the patient was reporting difficulty voiding. Otherwise, he was doing well.  Labs from July 11, 2011, revealed a white count of 8.8, hemoglobin of 10.5, and a hematocrit of 30.7.  His vital signs were stable.  He was alert and oriented x3, in no acute distress.  His neurovascular status was intact distally.  It was ordered for him to have an in-out-cath p.r.n.  He would continue PT and OT at this time.  On postop day #3, the patient was doing well from the hip standpoint. He was tolerating therapy, tolerating a regular diet.  His pain was well controlled with oral pain medications.  However, he was still complaining of difficulty voiding.  He had experienced a bowel movement. Per the nurses last in-out-cath at 6:45 a.m. revealed 700 mL.  Per   the patient, he reports a history of prostate problems in the past with no treatment.  Clinical exam, there was no shortness of breath or chest pain.  The abdomen was soft and nontender.  He was alert and oriented x3, in no acute distress.  His vital signs were stable and he was afebrile.  He was neurovascularly intact in the bilateral lower extremities.  His compartments remained soft and nontender.  The wound remained clean, dry, and intact.  The patient would continue with physical therapy while I tried to contact Urology to see if he could follow up with them as an outpatient.  Otherwise, the patient was stable.  DISCHARGE MEDICATIONS: 1. Percocet 5/325. 2. Robaxin 500 mg. 3. Zofran 4 mg. 4. MiraLax 17 g daily. 5. Micardis HCT 80/12.5 mg. 6.  Lipitor 20 mg.  Preprinted discharge instructions were provided to the patient at the time of discharge.  These instructions include an activity to walk with the assistance of a rolling walker.  Increase activity slowly.  May shower, however must pat dry.  He was to maintain a heart healthy diet. He was instructed to keep the wound clean, dry.  Followup appointments included a 2-week appointment with Dr. Brooks for wound check and suture removal in addition to a 1 week appointment with Joshua Bright with [facility].  DISPOSITION:  The patient was discharged with Foley in place and a leg bag.  The patient's condition at the time of discharge was considered stable.    ______________________________ Joshua Kovach, PA   ______________________________ Joshua D Brooks, MD    DK/MEDQ  D:  08/05/2011  T:  08/05/2011  Job:  [id number] 

## 2011-08-10 NOTE — Discharge Summary (Signed)
[name], Bright             ACCOUNT NO.:  [id number]  MEDICAL RECORD NO.:  [id number]  LOCATION:  5029                         FACILITY:  [facility]  PHYSICIAN:  Alvy Beal, MD    DATE OF BIRTH:  1927-01-31  DATE OF ADMISSION:  07/08/2011 DATE OF DISCHARGE:  07/13/2011                              DISCHARGE SUMMARY   ADMITTING DIAGNOSES: 1. Right intertrochanteric hip fracture. 2. Hypertension. 3. Hyperlipidemia.  DISCHARGE DIAGNOSES: 1. Right intertrochanteric hip fracture status post intramedullary     nail, right hip. 2. Hypertension 3. Hyperlipidemia.  SURGEON:  Alvy Beal, MD  ASSISTANT:  [name], PA-C  COMPLICATIONS:  None.  CONSULTS:  None.  BRIEF HISTORY:  [name] is an 77 year old man who presented to the emergency room on July 08, 2011 with a history of a fall.  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  He fell on his right hip and had immediate pain.  He was able to drag himself to safety under the fence.  After being evaluated in the emergency room upon arrival, Dr. Shon Baton, orthopedic surgeon on call was consulted for further evaluation and management.  On x-ray by report, he was found to have a mildly displaced intertrochanteric femur fracture with mild varus angulation.  Dr. Shon Baton discussed the risks, benefits, and alternatives of surgery with the patient.  [name] elected to proceed with the surgical procedure.  HOSPITAL COURSE:  On July 08, 2011, the patient was admitted to [facility] with the diagnosis of a right intertrochanteric hip fracture.  On July 09, 2011, the patient underwent the above- mentioned procedure without complication.  In stable condition he was transferred to the PACU and subsequently to the orthopedic floor.  On postop day #1, the patient was doing well.  He was afebrile with stable vital signs.  His dressings were clean, dry, and  intact.  There was no shortness of breath or chest pain.  There was positive flatus. His compartments were soft and nontender.  His neurovascular status in the bilateral lower extremities were intact.  He would continue mobilization with physical therapy.  Labs were drawn.  On postop day #2, the patient reported difficulty voiding.  He was otherwise doing well. His vital signs were stable.  His clinical exam was unchanged.  An order was placed for in and out cath p.r.n.  Please see the intake and output detail report for the specifics.  On postop day #3, the patient was doing well from the hip standpoint. He was tolerating therapy.  Tolerating a regular diet.  His pain was well controlled with oral pain medications.  His major complaint at that time was still difficulty voiding.  He had a bowel movement.  By nurse's report, his last in and out at 6:45 a.m. was 700 mL.  There was no shortness of breath.  There was no chest pain.  He was alert and oriented x3.  No acute distress.  His vital signs were stable and he was afebrile.  The abdomen was soft and nontender. Neurovascular status in the bilateral lower extremities were intact. His compartments were  soft and nontender.  His wound remained clean, dry, and intact.  After discussing treatment options with the patient regarding his inability to void on his own, it was determined that he would go home with a indwelling catheter and follow up with [facility] in 1 week.  DISCHARGE MEDICATIONS: 1. Percocet 5/325. 2. Robaxin 500 mg. 3. Zofran 4 mg. 4. MiraLax. 5. Micardis 80/12.5 mg. 6. Lipitor 20 mg.  DISCHARGE INSTRUCTIONS: 1. Activity.  Walk with assistance with the use of a rolling walker.     May shower.  Must pack wound dry. 2. Wound care.  Keep dressing clean and dry. 3. Followup appointment:     a.     Dr. Shon Baton.  Follow up in 2 weeks.     b.     Dr. Mena Goes, [facility].  Follow up in 1 week.  DISCHARGE PLAN:   The patient was discharged to home with the assistance of [facility] on July 13, 2011.  Appropriate preprinted discharge instructions were provided to the patient at the time of discharge.  All of his questions were encouraged, addressed, and answered.  He was discharged from the hospital on October, 29, 2012 in stable condition.    ______________________________ [name], PA   ______________________________ Alvy Beal, MD    DK/MEDQ  D:  07/23/2011  T:  07/23/2011  Job:  [id number]

## 2011-08-10 NOTE — Discharge Summary (Signed)
NAMEFINNIGAN, WARRINER             ACCOUNT NO.:  [id number]  MEDICAL RECORD NO.:  [id number]  LOCATION:  5029                         FACILITY:  [facility]  PHYSICIAN:  Alvy Beal, MD    DATE OF BIRTH:  1927-08-24  DATE OF ADMISSION:  07/08/2011 DATE OF DISCHARGE:  07/13/2011                              DISCHARGE SUMMARY   ADMITTING DIAGNOSES: 1. Right intertrochanteric fracture, status post fall. 2. Hypertension. 3. Hyperlipidemia.  DISCHARGE DIAGNOSES: 1. Right intertrochanteric fracture, status post right intramedullary     nail. 2. Hypertension. 3. Hyperlipidemia.  SURGEON:  Alvy Beal, MD  ASSISTANT:  [name], PA-C  PROCEDURE:  IM nail, right hip.  Please see the dictated OP note for the specifics.  COMPLICATIONS:  None.  CONSULTS:  None.  BRIEF HISTORY:  Joshua Bright is an 77 year old man who presented to the [facility] Emergency Room with a history of a fall.  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  He fell on his right hip and had immediate pain.  He is able to drag himself to safety away from the cattle.  He called for help and was brought to the emergency room where he was found to have a right hip fracture.  Dr. Shon Baton, orthopedic surgeon on-call was consulted for further evaluation and management.  X-rays of the right hip by report demonstrated a right intertrochanteric femur fracture.  Risks and benefits were reviewed by Dr. Shon Baton with the patient and his wife.  All of their questions were encouraged, addressed and answered. They elected to proceed with surgery.  HOSPITAL COURSE:  On July 09, 2011, the patient was brought to the Operating Room at [facility].  He underwent the above-stated procedure without complication.  In stable condition, he was transferred to the PACU and subsequently to the Orthopedic floor.  On postop day #1, the patient was doing well.  He was  afebrile with stable vital signs.  His wounds were clean, dry, and intact.  There was no shortness of breath or chest pain.  There was positive flatus.  His compartments remained soft and nontender.  He was neurovascularly intact.  Labs were ordered.  He would continue mobilization with physical therapy.  He would continue with his home dose of his blood pressure medications.  On postop day #2, the patient was reporting difficulty voiding. Otherwise, he was doing well.  Labs from July 11, 2011, revealed a white count of 8.8, hemoglobin of 10.5, and a hematocrit of 30.7.  His vital signs were stable.  He was alert and oriented x3, in no acute distress.  His neurovascular status was intact distally.  It was ordered for him to have an in-out-cath p.r.n.  He would continue PT and OT at this time.  On postop day #3, the patient was doing well from the hip standpoint. He was tolerating therapy, tolerating a regular diet.  His pain was well controlled with oral pain medications.  However, he was still complaining of difficulty voiding.  He had experienced a bowel movement. Per the nurses last in-out-cath at 6:45 a.m. revealed 700 mL.  Per  the patient, he reports a history of prostate problems in the past with no treatment.  Clinical exam, there was no shortness of breath or chest pain.  The abdomen was soft and nontender.  He was alert and oriented x3, in no acute distress.  His vital signs were stable and he was afebrile.  He was neurovascularly intact in the bilateral lower extremities.  His compartments remained soft and nontender.  The wound remained clean, dry, and intact.  The patient would continue with physical therapy while I tried to contact Urology to see if he could follow up with them as an outpatient.  Otherwise, the patient was stable.  DISCHARGE MEDICATIONS: 1. Percocet 5/325. 2. Robaxin 500 mg. 3. Zofran 4 mg. 4. MiraLax 17 g daily. 5. Micardis HCT 80/12.5 mg. 6.  Lipitor 20 mg.  Preprinted discharge instructions were provided to the patient at the time of discharge.  These instructions include an activity to walk with the assistance of a rolling walker.  Increase activity slowly.  May shower, however must pat dry.  He was to maintain a heart healthy diet. He was instructed to keep the wound clean, dry.  Followup appointments included a 2-week appointment with Dr. Shon Baton for wound check and suture removal in addition to a 1 week appointment with Dr. Mena Goes with [facility].  DISPOSITION:  The patient was discharged with Foley in place and a leg bag.  The patient's condition at the time of discharge was considered stable.    ______________________________ [name], PA   ______________________________ Alvy Beal, MD    DK/MEDQ  D:  08/05/2011  T:  08/05/2011  Job:  [id number]

## 2016-04-14 ENCOUNTER — Ambulatory Visit: Payer: Medicare Other | Attending: Neurosurgery | Admitting: Physical Therapy

## 2016-04-14 DIAGNOSIS — M6281 Muscle weakness (generalized): Secondary | ICD-10-CM

## 2016-04-14 DIAGNOSIS — R260 Ataxic gait: Secondary | ICD-10-CM | POA: Diagnosis present

## 2016-04-14 NOTE — Therapy (Signed)
[facility] [address] [place], [place], [address] Phone: [phone]   Fax:  [phone]  Physical Therapy Evaluation  Patient Details  Name: GABRIELLA VANDENBOSCH MRN: [id number] Date of Birth: 11-01-1926 No Data Recorded  Encounter Date: 04/14/2016      PT End of Session - 04/14/16 1203    Visit Number 1   Number of Visits 16   Date for PT Re-Evaluation 06/13/16   PT Start Time 1122   PT Stop Time 1207   PT Time Calculation (min) 45 min   Activity Tolerance Patient tolerated treatment well   Behavior During Therapy [facility] for tasks assessed/performed      No past medical history on file.  No past surgical history on file.  There were no vitals filed for this visit.       Subjective Assessment - 04/14/16 1201    Subjective I want to walk better.  Recommended the patient use a walker.  His wife was with him today and states that they have a walker but he will not use it.   Pertinent History Spinal stenosis; right hip fx.   Limitations Walking   How long can you walk comfortably? 10 minutes.   Patient Stated Goals Walk better and improve balance.   Currently in Pain? No/denies            [facility] PT Assessment - 04/14/16 0001      Assessment   Medical Diagnosis Spinal stenosis of lumbar spine.   Referring Provider --  Maeola Harman MD   Onset Date/Surgical Date --  Ongoing.     Precautions   Precautions Fall     Restrictions   Weight Bearing Restrictions No     Balance Screen   Has the patient fallen in the past 6 months Yes   How many times? --  1.   Has the patient had a decrease in activity level because of a fear of falling?  No   Is the patient reluctant to leave their home because of a fear of falling?  No     Home Environment   Living Environment Private residence     Prior Function   Level of Independence Independent     Posture/Postural Control   Posture/Postural Control Postural limitations   Postural Limitations Rounded Shoulders;Forward head;Decreased lumbar lordosis;Decreased thoracic kyphosis;Posterior pelvic tilt;Flexed trunk   Posture Comments Right LE ER and bilateral knee flexion.     ROM / Strength   AROM / PROM / Strength AROM;Strength     AROM   Overall AROM Comments WFL for bilateral LE AROM.     Strength   Overall Strength Comments Right hip abduction= 4-/5; left= 4/5; bilateral knee strength= 4/5.     Special Tests    Special Tests --  (+) Romberg test.     Transfers   Transfers Sit to Stand;Supine to Sit   Sit to Stand 4: Min guard   Supine to Sit 4: Min assist     Ambulation/Gait   Gait Pattern Decreased step length - right;Decreased step length - left;Decreased stance time - right;Decreased stance time - left;Right flexed knee in stance;Left flexed knee in stance;Ataxic   Ambulation Surface --  Right LE ER.                             PT Short Term Goals - 04/14/16 1210      PT SHORT TERM GOAL #  1   Title Ind with an initial HEP.   Time 2   Period Weeks   Status New           PT Long Term Goals - May 06, 2016 1211      PT LONG TERM GOAL #1   Title Independent with an advanced HEP.   Time 8   Period Weeks   Status New     PT LONG TERM GOAL #2   Title Negative Romberg test.   Time 8   Period Weeks   Status New     PT LONG TERM GOAL #3   Title Sit to stand with supervsion only.   Time 8   Period Weeks   Status New     PT LONG TERM GOAL #4   Title Supine to sit with supervision only.   Time 8   Period Weeks   Status New               Plan - 05/06/2016 1219    Clinical Impairments Affecting Rehab Potential Ataxic gait pattern.      Patient will benefit from skilled therapeutic intervention in order to improve the following deficits and impairments:  Abnormal gait, Decreased activity tolerance, Decreased strength, Decreased mobility  Visit Diagnosis: Ataxic gait - Plan: PT plan of care  cert/re-cert  Muscle weakness (generalized) - Plan: PT plan of care cert/re-cert      G-Codes - 05-06-16 1200    Functional Assessment Tool Used Clinical judgement.   Functional Limitation Mobility: Walking and moving around   Mobility: Walking and Moving Around Current Status [phone]) At least 20 percent but less than 40 percent impaired, limited or restricted       Problem List There are no active problems to display for this patient.   Tavon Magnussen, [place] MPT 05-06-2016, 12:21 PM  [facility] [address] [place], [place], [address] Phone: [phone]   Fax:  [phone]  Name: NEEMA FLUEGGE MRN: [id number] Date of Birth: [birth year]

## 2016-04-16 ENCOUNTER — Ambulatory Visit: Payer: Medicare Other | Admitting: Physical Therapy

## 2016-04-16 DIAGNOSIS — R260 Ataxic gait: Secondary | ICD-10-CM | POA: Diagnosis not present

## 2016-04-16 DIAGNOSIS — M6281 Muscle weakness (generalized): Secondary | ICD-10-CM

## 2016-04-16 NOTE — Therapy (Signed)
[facility] [address] [place], [place], [address] Phone: [phone]   Fax:  [phone]  Physical Therapy Treatment  Patient Details  Name: Joshua Bright MRN: [id number] Date of Birth: [birth year] No Data Recorded  Encounter Date: 04/16/2016      PT End of Session - 04/16/16 1237    Visit Number 2   Number of Visits 16   Date for PT Re-Evaluation 06/13/16   PT Start Time 1032      No past medical history on file.  No past surgical history on file.  There were no vitals filed for this visit.      Subjective Assessment - 04/16/16 1236    Subjective No new complaints.     Treatment:  Nustep level 4 x 18 minutes; f/b Rockerboard and inverted BOSU ball in parallel bars for 18 minutes total f/b Orange XTS resisted walking and static standing forward and backward x 5 minutes.  Excellent job today.                              PT Short Term Goals - 04/14/16 1210      PT SHORT TERM GOAL #1   Title Ind with an initial HEP.   Time 2   Period Weeks   Status New           PT Long Term Goals - 04/14/16 1211      PT LONG TERM GOAL #1   Title Independent with an advanced HEP.   Time 8   Period Weeks   Status New     PT LONG TERM GOAL #2   Title Negative Romberg test.   Time 8   Period Weeks   Status New     PT LONG TERM GOAL #3   Title Sit to stand with supervsion only.   Time 8   Period Weeks   Status New     PT LONG TERM GOAL #4   Title Supine to sit with supervision only.   Time 8   Period Weeks   Status New             Patient will benefit from skilled therapeutic intervention in order to improve the following deficits and impairments:  Abnormal gait, Decreased activity tolerance, Decreased strength, Decreased mobility  Visit Diagnosis: Ataxic gait  Muscle weakness (generalized)     Problem List There are no active problems to display for this  patient.   Samvel Zinn, [place] MPT 04/16/2016, 12:42 PM  [facility] [address] [place], [place], [address] Phone: [phone]   Fax:  [phone]  Name: Joshua Bright MRN: [id number] Date of Birth: [birth year]

## 2016-04-20 ENCOUNTER — Encounter: Payer: Medicare Other | Admitting: Physical Therapy

## 2016-04-22 ENCOUNTER — Ambulatory Visit: Payer: Medicare Other | Admitting: Physical Therapy

## 2016-04-22 ENCOUNTER — Encounter: Payer: Medicare Other | Admitting: Physical Therapy

## 2016-04-22 DIAGNOSIS — R260 Ataxic gait: Secondary | ICD-10-CM

## 2016-04-22 DIAGNOSIS — M6281 Muscle weakness (generalized): Secondary | ICD-10-CM

## 2016-04-22 NOTE — Therapy (Signed)
[facility] [address] [place], [place], [address] Phone: [phone]   Fax:  [phone]  Physical Therapy Treatment  Patient Details  Name: Joshua Bright MRN: [id number] Date of Birth: 01/31/1927 No Data Recorded  Encounter Date: 04/22/2016      PT End of Session - 04/22/16 1030    Visit Number 3   Number of Visits 16   Date for PT Re-Evaluation 06/13/16   PT Start Time 1031   PT Stop Time 1114   PT Time Calculation (min) 43 min   Activity Tolerance Patient tolerated treatment well   Behavior During Therapy [facility] for tasks assessed/performed      No past medical history on file.  No past surgical history on file.  There were no vitals filed for this visit.      Subjective Assessment - 04/22/16 1030    Subjective Reports that his knees and ankles were sore following the balance exercises last treatment.   Patient is accompained by: Family member  Wife   Pertinent History Spinal stenosis; right hip fx.   Limitations Walking   How long can you walk comfortably? 10 minutes.   Patient Stated Goals Walk better and improve balance.            [facility] PT Assessment - 04/22/16 0001      Assessment   Medical Diagnosis Spinal stenosis of lumbar spine.     Precautions   Precautions Fall     Restrictions   Weight Bearing Restrictions No                     [facility] Adult PT Treatment/Exercise - 04/22/16 0001      Exercises   Exercises Knee/Hip     Knee/Hip Exercises: Aerobic   Nustep L4 x15 min     Knee/Hip Exercises: Standing   Heel Raises Both;2 sets;10 reps  B toe raise x20 reps     Knee/Hip Exercises: Seated   Long Arc Quad Strengthening;Both;2 sets;10 reps;Weights   Long Arc Quad Weight 4 lbs.   Sit to Sand 20 reps;without UE support     Knee/Hip Exercises: Supine   Bridges Strengthening;2 sets;10 reps   Straight Leg Raises Strengthening;Both;2 sets;10 reps   Other Supine Knee/Hip  Exercises Supine clamshell 2x10 reps green therband             Balance Exercises - 04/22/16 1101      Balance Exercises: Standing   Standing Eyes Opened Foam/compliant surface;Narrow base of support (BOS)  DLS with weightshifting x3 min   Tandem Stance Eyes open;Foam/compliant surface  x3 min   Standing, One Foot on a Step Eyes open;Trunk rotation  x1 min each leg   Other Standing Exercises Heels on foam x3 min, 2# ball reachouts on foam x30 reps             PT Short Term Goals - 04/14/16 1210      PT SHORT TERM GOAL #1   Title Ind with an initial HEP.   Time 2   Period Weeks   Status New           PT Long Term Goals - 04/14/16 1211      PT LONG TERM GOAL #1   Title Independent with an advanced HEP.   Time 8   Period Weeks   Status New     PT LONG TERM GOAL #2   Title Negative Romberg test.   Time 8  Period Weeks   Status New     PT LONG TERM GOAL #3   Title Sit to stand with supervsion only.   Time 8   Period Weeks   Status New     PT LONG TERM GOAL #4   Title Supine to sit with supervision only.   Time 8   Period Weeks   Status New               Plan - 04/22/16 1133    Clinical Impression Statement Patient tolerated today's treatment well with decreased difficulty with balance exercises. Patient tolerated LE strengthening exercises well and required minimal-moderate multimodal cueing for proper exercise technique and corrections. Airex was utilized today for balance exercises to decrease the difficulty and to prevent further soreness. Patient required fingertip assist for balance exercises when attempting exercises without UE support. Patient demonstrated tendency to place weight more posteriorly in heels thus heels on airex exercise completed. Patient amb[facility] with [facility] at this time with wife present.   Rehab Potential Good   Clinical Impairments Affecting Rehab Potential Ataxic gait pattern.   PT Frequency 2x / week   PT Duration 8  weeks   PT Treatment/Interventions ADLs/Self Care Home Management;Gait training;Therapeutic activities;Therapeutic exercise;Balance training;Neuromuscular re-education;Patient/family education   PT Next Visit Plan Nustep; bilateral LE strengthening; balance and gait training.   Consulted and Agree with Plan of Care Patient;Family member/caregiver   Family Member Consulted Wife      Patient will benefit from skilled therapeutic intervention in order to improve the following deficits and impairments:  Abnormal gait, Decreased activity tolerance, Decreased strength, Decreased mobility  Visit Diagnosis: Ataxic gait  Muscle weakness (generalized)     Problem List There are no active problems to display for this patient.   Evelene CroonKelsey M Parsons, PTA 04/22/2016, 11:38 AM  [facility] [address] [place], [place], [address] Phone: [phone]   Fax:  [phone]  Name: Joshua Bright MRN: [id number] Date of Birth: 01-14-1927

## 2016-04-23 ENCOUNTER — Ambulatory Visit: Payer: Medicare Other | Admitting: *Deleted

## 2016-04-23 DIAGNOSIS — R260 Ataxic gait: Secondary | ICD-10-CM

## 2016-04-23 DIAGNOSIS — M6281 Muscle weakness (generalized): Secondary | ICD-10-CM

## 2016-04-23 NOTE — Therapy (Signed)
[facility] [address] [place], [place], [address] Phone: [phone]   Fax:  [phone]  Physical Therapy Treatment  Patient Details  Name: Joshua Bright MRN: [id number] Date of Birth: [birth year] No Data Recorded  Encounter Date: 04/23/2016      PT End of Session - 04/23/16 1047    Visit Number 4   Number of Visits 16   Date for PT Re-Evaluation 06/13/16   PT Start Time 1030   PT Stop Time 1119   PT Time Calculation (min) 49 min      No past medical history on file.  No past surgical history on file.  There were no vitals filed for this visit.      Subjective Assessment - 04/23/16 1046    Subjective Did good after last Rx. Balance helps on blue pad   Patient is accompained by: Family member   Pertinent History Spinal stenosis; right hip fx.   Limitations Walking   How long can you walk comfortably? 10 minutes.   Patient Stated Goals Walk better and improve balance.   Currently in Pain? No/denies                         [facility] Adult PT Treatment/Exercise - 04/23/16 0001      Exercises   Exercises Knee/Hip     Knee/Hip Exercises: Aerobic   Nustep L4 x18 min     Knee/Hip Exercises: Standing   Heel Raises Both;10 reps;3 sets  B toe raise x20 reps     Knee/Hip Exercises: Seated   Long Arc Quad Strengthening;Both;10 reps;Weights;3 sets   Long Arc Quad Weight 4 lbs.   Sit to Sand 20 reps;without UE support             Balance Exercises - 04/23/16 1055      Balance Exercises: Standing   Standing Eyes Opened Foam/compliant surface;Narrow base of support (BOS)  UE diagonal reaching 3x20   Tandem Stance Eyes open;Foam/compliant surface   Standing, One Foot on a Step Eyes open;Trunk rotation       Toe touches on 6in step x 20 with SBA/ CGA         PT Short Term Goals - 04/23/16 1114      PT SHORT TERM GOAL #1   Title Ind with an initial HEP.   Period Weeks   Status Achieved            PT Long Term Goals - 04/14/16 1211      PT LONG TERM GOAL #1   Title Independent with an advanced HEP.   Time 8   Period Weeks   Status New     PT LONG TERM GOAL #2   Title Negative Romberg test.   Time 8   Period Weeks   Status New     PT LONG TERM GOAL #3   Title Sit to stand with supervsion only.   Time 8   Period Weeks   Status New     PT LONG TERM GOAL #4   Title Supine to sit with supervision only.   Time 8   Period Weeks   Status New               Plan - 04/23/16 1050    Clinical Impression Statement Pt did fairly well with Rx today and was able to perform therex and balance act's with no increased pain. He was challenged on airex with  reaching act.'s and needs CGA to correct at times. No LOB during toe touches on steps today and did well with sit to stand ex.Marland Kitchen He feels he is independent in Initial HEP and is doing some at home   Rehab Potential Good   Clinical Impairments Affecting Rehab Potential Ataxic gait pattern.   PT Frequency 2x / week   PT Duration 8 weeks   PT Treatment/Interventions ADLs/Self Care Home Management;Gait training;Therapeutic activities;Therapeutic exercise;Balance training;Neuromuscular re-education;Patient/family education   PT Next Visit Plan Nustep; bilateral LE strengthening; balance and gait training.   Consulted and Agree with Plan of Care Patient;Family member/caregiver      Patient will benefit from skilled therapeutic intervention in order to improve the following deficits and impairments:  Abnormal gait, Decreased activity tolerance, Decreased strength, Decreased mobility  Visit Diagnosis: Ataxic gait  Muscle weakness (generalized)     Problem List There are no active problems to display for this patient.   Dareen Gutzwiller,CHRIS, PTA 04/23/2016, 11:30 AM  [facility] [address] [place], [place], [address] Phone: [phone]   Fax:  [phone]  Name:  Joshua Bright MRN: [id number] Date of Birth: 03-06-1927

## 2016-04-30 ENCOUNTER — Ambulatory Visit: Payer: Medicare Other | Admitting: *Deleted

## 2016-04-30 DIAGNOSIS — R260 Ataxic gait: Secondary | ICD-10-CM | POA: Diagnosis not present

## 2016-04-30 DIAGNOSIS — M6281 Muscle weakness (generalized): Secondary | ICD-10-CM

## 2016-04-30 NOTE — Therapy (Signed)
[facility] [address] [place], [place], [address] Phone: [phone]   Fax:  [phone]  Physical Therapy Treatment  Patient Details  Name: Joshua Bright C Hefner MRN: [id number] Date of Birth: [birth year] No Data Recorded  Encounter Date: 04/30/2016      PT End of Session - 04/30/16 1140    Visit Number 5   Number of Visits 16   Date for PT Re-Evaluation 06/13/16   PT Start Time 1115   PT Stop Time 1205   PT Time Calculation (min) 50 min      No past medical history on file.  No past surgical history on file.  There were no vitals filed for this visit.                       [facility] Adult PT Treatment/Exercise - 04/30/16 0001      Exercises   Exercises Knee/Hip     Knee/Hip Exercises: Aerobic   Nustep L4 x20 min     Knee/Hip Exercises: Standing   Heel Raises Both;10 reps;3 sets  B toe raise x20 reps     Knee/Hip Exercises: Seated   Long Arc Quad Strengthening;Both;10 reps;Weights;4 sets   Long Arc Quad Weight 4 lbs.   Sit to Sand 20 reps;without UE support             Balance Exercises - 04/30/16 1124      Balance Exercises: Standing   Standing Eyes Opened Foam/compliant surface;Narrow base of support (BOS)  Bil UE reach outs 3x 20   Tandem Stance Eyes open;Foam/compliant surface   Standing, One Foot on a Step Eyes open;Trunk rotation   Rockerboard Anterior/posterior  3 mins             PT Short Term Goals - 04/23/16 1114      PT SHORT TERM GOAL #1   Title Ind with an initial HEP.   Period Weeks   Status Achieved           PT Long Term Goals - 04/30/16 1138      PT LONG TERM GOAL #1   Title Independent with an advanced HEP.   Time 8   Period Weeks   Status On-going     PT LONG TERM GOAL #2   Title Negative Romberg test.   Time 8   Period Weeks   Status On-going     PT LONG TERM GOAL #3   Title Sit to stand with supervsion only.   Time 8   Period Weeks   Status On-going     PT LONG TERM GOAL #4   Title Supine to sit with supervision only.   Time 8   Period Weeks   Status On-going               Plan - 04/30/16 1141    Clinical Impression Statement Pt did fairly well again with Rx and feels it is helping. He is able to perform sit to stand a little easier today and had better control during balance activities. He still has a positive Romberg test and was unable to meet any LTGs today due to deficits in balance and strength.    Clinical Impairments Affecting Rehab Potential Ataxic gait pattern.   PT Frequency 2x / week   PT Duration 8 weeks   PT Treatment/Interventions ADLs/Self Care Home Management;Gait training;Therapeutic activities;Therapeutic exercise;Balance training;Neuromuscular re-education;Patient/family education   PT Next Visit Plan Nustep; bilateral LE strengthening; balance and  gait training.   Consulted and Agree with Plan of Care Patient;Family member/caregiver      Patient will benefit from skilled therapeutic intervention in order to improve the following deficits and impairments:  Abnormal gait, Decreased activity tolerance, Decreased strength, Decreased mobility  Visit Diagnosis: Ataxic gait  Muscle weakness (generalized)     Problem List There are no active problems to display for this patient.   RAMSEUR,CHRIS, PTA 04/30/2016, 12:08 PM  [facility] [address] [place], [place], [address] Phone: [phone]   Fax:  [phone]  Name: Joshua Bright C Stamper MRN: [id number] Date of Birth: [birth year]

## 2016-05-01 ENCOUNTER — Encounter: Payer: Medicare Other | Admitting: Physical Therapy

## 2016-05-05 ENCOUNTER — Ambulatory Visit: Payer: Medicare Other | Admitting: *Deleted

## 2016-05-05 DIAGNOSIS — R260 Ataxic gait: Secondary | ICD-10-CM

## 2016-05-05 DIAGNOSIS — M6281 Muscle weakness (generalized): Secondary | ICD-10-CM

## 2016-05-05 NOTE — Therapy (Signed)
[facility] [address] [place], [place], [address] Phone: [phone]   Fax:  [phone]  Physical Therapy Treatment  Patient Details  Name: Joshua Bright MRN: [id number] Date of Birth: [birth year] No Data Recorded  Encounter Date: 05/05/2016      PT End of Session - 05/05/16 1053    Visit Number 6   Number of Visits 16   Date for PT Re-Evaluation 06/13/16   PT Start Time 1035   PT Stop Time 1120   PT Time Calculation (min) 45 min      No past medical history on file.  No past surgical history on file.  There were no vitals filed for this visit.      Subjective Assessment - 05/05/16 1036    Subjective Did good after last Rx. Balance helps on blue pad. Legs feel weak   Patient is accompained by: Family member   Pertinent History Spinal stenosis; right hip fx.   Limitations Walking   How long can you walk comfortably? 10 minutes.   Patient Stated Goals Walk better and improve balance.                         [facility] - 05/05/16 0001      Exercises   Exercises Knee/Hip     Knee/Hip Exercises: Aerobic   Nustep L4 x20 min     Knee/Hip Exercises: Standing   Heel Raises Both;10 reps;3 sets     Knee/Hip Exercises: Seated   Long Arc Quad Strengthening;Both;Weights;5 sets;15 reps   Long Arc Quad Weight 4 lbs.   Sit to Sand 20 reps;without UE support             Balance Exercises - 05/05/16 1124      Balance Exercises: Standing   Standing Eyes Opened Foam/compliant surface;Narrow base of support (BOS)   Tandem Stance Eyes open;Foam/compliant surface   Standing, One Foot on a Step Eyes open;Trunk rotation   Rockerboard Anterior/posterior   Step Ups Forward;6 inch  toe touches for balance 2x20 SBA 1 LOB today             PT Short Term Goals - 04/23/16 1114      PT SHORT TERM GOAL #1   Title Ind with an initial HEP.   Period Weeks   Status Achieved            PT Long Term Goals - 04/30/16 1138      PT LONG TERM GOAL #1   Title Independent with an advanced HEP.   Time 8   Period Weeks   Status On-going     PT LONG TERM GOAL #2   Title Negative Romberg test.   Time 8   Period Weeks   Status On-going     PT LONG TERM GOAL #3   Title Sit to stand with supervsion only.   Time 8   Period Weeks   Status On-going     PT LONG TERM GOAL #4   Title Supine to sit with supervision only.   Time 8   Period Weeks   Status On-going               Plan - 05/05/16 1053    Clinical Impression Statement Pt arrived to clinic ambulating with SPC. He continues progress with balance exs and had only one LOB during 6 in. toe touches today. He still needs constant supervision during act's and  CGA  to regain balance at times. He wanted to increase reps on LAGs today and did well. Goals are ongoing at this time due to strength and balance deficits.   Clinical Impairments Affecting Rehab Potential Ataxic gait pattern.   PT Frequency 2x / week   PT Duration 8 weeks   PT Treatment/Interventions ADLs/Self Care Home Management;Gait training;Therapeutic activities;Therapeutic exercise;Balance training;Neuromuscular re-education;Patient/family education   PT Next Visit Plan Nustep; bilateral LE strengthening; balance and gait training.   Consulted and Agree with Plan of Care Patient;Family member/caregiver   Family Member Consulted Wife      Patient will benefit from skilled therapeutic intervention in order to improve the following deficits and impairments:  Abnormal gait, Decreased activity tolerance, Decreased strength, Decreased mobility  Visit Diagnosis: Ataxic gait  Muscle weakness (generalized)     Problem List There are no active problems to display for this patient.   RAMSEUR,CHRIS, PTA 05/05/2016, 11:26 AM  [facility] [address] [place], [place], [address] Phone:  [phone]   Fax:  [phone]  Name: Joshua Bright MRN: [id number] Date of Birth: 23-May-1927

## 2016-05-07 ENCOUNTER — Ambulatory Visit: Payer: Medicare Other | Admitting: *Deleted

## 2016-05-07 DIAGNOSIS — R260 Ataxic gait: Secondary | ICD-10-CM

## 2016-05-07 DIAGNOSIS — M6281 Muscle weakness (generalized): Secondary | ICD-10-CM

## 2016-05-07 NOTE — Therapy (Signed)
[facility] [address] [place], [place], [address] Phone: [phone]   Fax:  [phone]  Physical Therapy Treatment  Patient Details  Name: Joshua Bright MRN: [id number] Date of Birth: 1927-01-04 No Data Recorded  Encounter Date: 05/07/2016      PT End of Session - 05/07/16 1043    Visit Number 7   Number of Visits 16   Date for PT Re-Evaluation 06/13/16   PT Start Time 1030   PT Stop Time 1119   PT Time Calculation (min) 49 min      No past medical history on file.  No past surgical history on file.  There were no vitals filed for this visit.      Subjective Assessment - 05/07/16 1037    Subjective Did good after last Rx. Balance helps on blue pad. Legs feel a little stronger today   Patient is accompained by: Family member   Pertinent History Spinal stenosis; right hip fx.   Limitations Walking   How long can you walk comfortably? 10 minutes.   Patient Stated Goals Walk better and improve balance.   Currently in Pain? No/denies                         [facility] - 05/07/16 0001      Exercises   Exercises Knee/Hip     Knee/Hip Exercises: Aerobic   Nustep L4 x20 min UE/LE activity with good posture     Knee/Hip Exercises: Standing   Heel Raises Both;10 reps;3 sets     Knee/Hip Exercises: Seated   Long Arc Quad Strengthening;Both;Weights;5 sets;15 reps   Long Arc Quad Weight 5 lbs.   Sit to Sand 20 reps;without UE support             Balance Exercises - 05/07/16 1103      Balance Exercises: Standing   Standing Eyes Opened Foam/compliant surface;Narrow base of support (BOS)  UE reaching 3x20   Tandem Stance Eyes open;Foam/compliant surface   Standing, One Foot on a Step Eyes open;Trunk rotation   Rockerboard Anterior/posterior   Step Ups Forward;6 inch             PT Short Term Goals - 04/23/16 1114      PT SHORT TERM GOAL #1   Title Ind with  an initial HEP.   Period Weeks   Status Achieved           PT Long Term Goals - 05/07/16 1109      PT LONG TERM GOAL #3   Title Sit to stand with supervsion only.   Time 8   Period Weeks   Status Achieved               Plan - 05/07/16 1044    Clinical Impression Statement Pt did better today and was able to meet LTG for sit to stand today, but was unable to meet LTG for Romberg Test. He can tell his balance is doing  better and is walking some at home without his [facility].Other goals are ongoing at this time   Rehab Potential Good   Clinical Impairments Affecting Rehab Potential Ataxic gait pattern.   PT Frequency 2x / week   PT Duration 8 weeks   PT Treatment/Interventions ADLs/Self Care Home Management;Gait training;Therapeutic activities;Therapeutic exercise;Balance training;Neuromuscular re-education;Patient/family education   PT Next Visit Plan Nustep; bilateral LE strengthening; balance and gait training.   Consulted and Agree with  Plan of Care Patient;Family member/caregiver   Family Member Consulted Wife      Patient will benefit from skilled therapeutic intervention in order to improve the following deficits and impairments:  Abnormal gait, Decreased activity tolerance, Decreased strength, Decreased mobility  Visit Diagnosis: Ataxic gait  Muscle weakness (generalized)     Problem List There are no active problems to display for this patient.   Cashis Rill,CHRIS, PTA 05/07/2016, 11:19 AM  [facility] [address] [place], [place], [address] Phone: [phone]   Fax:  [phone]  Name: Joshua Bright MRN: [id number] Date of Birth: 04-22-1927

## 2016-05-12 ENCOUNTER — Ambulatory Visit: Payer: Medicare Other | Admitting: Physical Therapy

## 2016-05-12 DIAGNOSIS — R260 Ataxic gait: Secondary | ICD-10-CM | POA: Diagnosis not present

## 2016-05-12 DIAGNOSIS — M6281 Muscle weakness (generalized): Secondary | ICD-10-CM

## 2016-05-12 NOTE — Therapy (Signed)
[facility] [address] [place], [place], [address] Phone: [phone]   Fax:  [phone]  Physical Therapy Treatment  Patient Details  Name: Joshua Bright MRN: [id number] Date of Birth: 1927-09-03 No Data Recorded  Encounter Date: 05/12/2016      PT End of Session - 05/12/16 1043    Visit Number 8   Number of Visits 16   Date for PT Re-Evaluation 06/13/16   PT Start Time 1036   PT Stop Time 1118   PT Time Calculation (min) 42 min   Activity Tolerance Patient tolerated treatment well   Behavior During Therapy [facility] for tasks assessed/performed      No past medical history on file.  No past surgical history on file.  There were no vitals filed for this visit.      Subjective Assessment - 05/12/16 1042    Subjective Reports that he has noticed "a little bit" of improvement with his balance but knows that his hip and back limit him as well.   Pertinent History Spinal stenosis; right hip fx.   Limitations Walking   How long can you walk comfortably? 10 minutes.   Patient Stated Goals Walk better and improve balance.   Currently in Pain? No/denies            [facility] PT Assessment - 05/12/16 0001      Assessment   Medical Diagnosis Spinal stenosis of lumbar spine.     Precautions   Precautions Fall     Restrictions   Weight Bearing Restrictions No                     [facility] Adult PT Treatment/Exercise - 05/12/16 0001      Knee/Hip Exercises: Aerobic   Nustep L4 x22 min UE/LE activity with good posture     Knee/Hip Exercises: Seated   Long Arc Quad Strengthening;Both;3 sets;10 reps;Weights   Long Arc Quad Weight 5 lbs.   Sit to Sand 20 reps;without UE support     Knee/Hip Exercises: Supine   Straight Leg Raises Strengthening;Both;3 sets;10 reps   Other Supine Knee/Hip Exercises Supine clamshell 3x10 reps red therband             Balance Exercises - 05/12/16 1116      Balance  Exercises: Standing   Tandem Gait Forward;Retro;Intermittent upper extremity support;Foam/compliant surface  x2 min   Sidestepping Foam/compliant support;Upper extremity support  x2 min   Marching Limitations on airex x2 min   Heel Raises Limitations 4x10 reps   Toe Raise Limitations 2x10 reps             PT Short Term Goals - 04/23/16 1114      PT SHORT TERM GOAL #1   Title Ind with an initial HEP.   Period Weeks   Status Achieved           PT Long Term Goals - 05/07/16 1109      PT LONG TERM GOAL #3   Title Sit to stand with supervsion only.   Time 8   Period Weeks   Status Achieved               Plan - 05/12/16 1159    Clinical Impression Statement Patient continues to progress well with strengthening and balance activities with no report of pain or discomfort of any type. Patient able to progress to gait balance exercises on beam and required intermittant to one UE support. Patient continues to  utilize [facility] especially in unfamiliar enviroments per patient report. Patient indicated that he has difficulty with uneven surfaces such as his garden.    Rehab Potential Good   Clinical Impairments Affecting Rehab Potential Ataxic gait pattern.   PT Frequency 2x / week   PT Duration 8 weeks   PT Treatment/Interventions ADLs/Self Care Home Management;Gait training;Therapeutic activities;Therapeutic exercise;Balance training;Neuromuscular re-education;Patient/family education   PT Next Visit Plan Nustep; bilateral LE strengthening; balance and gait training.   Consulted and Agree with Plan of Care Patient      Patient will benefit from skilled therapeutic intervention in order to improve the following deficits and impairments:  Abnormal gait, Decreased activity tolerance, Decreased strength, Decreased mobility  Visit Diagnosis: Ataxic gait  Muscle weakness (generalized)     Problem List There are no active problems to display for this patient.   Evelene CroonKelsey M  Parsons, PTA 05/12/2016, 12:03 PM  [facility] [address] [place], [place], [address] Phone: [phone]   Fax:  [phone]  Name: Joshua Bright MRN: [id number] Date of Birth: [birth year]

## 2016-05-14 ENCOUNTER — Encounter: Payer: Medicare Other | Admitting: *Deleted

## 2016-05-20 ENCOUNTER — Ambulatory Visit: Payer: Medicare Other | Attending: Neurosurgery | Admitting: Physical Therapy

## 2016-05-20 DIAGNOSIS — M6281 Muscle weakness (generalized): Secondary | ICD-10-CM | POA: Diagnosis present

## 2016-05-20 DIAGNOSIS — R260 Ataxic gait: Secondary | ICD-10-CM | POA: Insufficient documentation

## 2016-05-20 NOTE — Therapy (Signed)
[facility] [address] [place], [place], [address] Phone: [phone]   Fax:  [phone]  Physical Therapy Treatment  Patient Details  Name: Joshua Bright MRN: [id number] Date of Birth: 1926/12/28 No Data Recorded  Encounter Date: 05/20/2016      PT End of Session - 05/20/16 1029    Visit Number 9   Number of Visits 16   Date for PT Re-Evaluation 06/13/16   PT Start Time 1034   PT Stop Time 1116   PT Time Calculation (min) 42 min   Activity Tolerance Patient tolerated treatment well   Behavior During Therapy [facility] for tasks assessed/performed      No past medical history on file.  No past surgical history on file.  There were no vitals filed for this visit.      Subjective Assessment - 05/20/16 1035    Subjective Reports that he fell last week on concrete and reports that yesterday he vacuumed a small room and was almost in tears due to the low back pain. Reports that his back MD does not want to operate on him. Reports that his fall last week was the worst one that he can remember and bruised his shoulder and hit his head on concrete.   Pertinent History Spinal stenosis; right hip fx.   Limitations Walking   How long can you walk comfortably? 10 minutes.   Patient Stated Goals Walk better and improve balance.   Currently in Pain? Other (Comment)  Reports that he only has back pain intermittantly if bending over (05/20/2016)            [facility] PT Assessment - 05/20/16 0001      Assessment   Medical Diagnosis Spinal stenosis of lumbar spine.     Precautions   Precautions Fall     Restrictions   Weight Bearing Restrictions No                     [facility] Adult PT Treatment/Exercise - 05/20/16 0001      Knee/Hip Exercises: Aerobic   Nustep L5 x20 min UE/LE activity with good posture     Knee/Hip Exercises: Seated   Long Arc Quad Strengthening;Both;3 sets;10 reps;Weights   Long Arc Quad Weight 5 lbs.    Other Seated Knee/Hip Exercises Seated reachouts with 4# ball x30 reps, B PNF seated with 4# ball x30 reps     Knee/Hip Exercises: Supine   Bridges Strengthening   Bridges Limitations x 35 reps   Bridges with Clamshell Strengthening;Both;3 sets;10 reps  green theraband   Straight Leg Raises Strengthening;Both;3 sets;10 reps   Other Supine Knee/Hip Exercises B supine hip abduct/adduct x30 reps             Balance Exercises - 05/20/16 1117      Balance Exercises: Standing   Standing Eyes Opened Narrow base of support (BOS);Wide (BOA);Foam/compliant surface  Wide BOS with pertrubations and static x2 min, Narrow x2 min   Step Ups Forward;6 inch;UE support 1;Intermittent UE support  x35 reps each   Marching Limitations on airex x2 min   Other Standing Exercises Foam DLS with B shoulder retraction green theraband x20 reps             PT Short Term Goals - 04/23/16 1114      PT SHORT TERM GOAL #1   Title Ind with an initial HEP.   Period Weeks   Status Achieved  PT Long Term Goals - 05/20/16 1029      PT LONG TERM GOAL #1   Title Independent with an advanced HEP.   Time 8   Period Weeks   Status On-going     PT LONG TERM GOAL #2   Title Negative Romberg test.   Time 8   Period Weeks   Status On-going     PT LONG TERM GOAL #3   Title Sit to stand with supervsion only.   Time 8   Period Weeks   Status Achieved     PT LONG TERM GOAL #4   Title Supine to sit with supervision only.   Time 8   Period Weeks   Status On-going               Plan - 05/20/16 1120    Clinical Impression Statement Patient continues to present in clinic with trunk flexion and AD for ambulation. Patient required frequent and repititve VCs for exercise technique and corrections due to [facility]. Patient experienced back feeling best in extension such as with bridge exercise. Patient was given VCs regarding core activation to assist with low back pain but patient  partially compliant. Patient tolerated intermittant to one UE support with balance exercises.   Rehab Potential Good   Clinical Impairments Affecting Rehab Potential Ataxic gait pattern.   PT Frequency 2x / week   PT Duration 8 weeks   PT Treatment/Interventions ADLs/Self Care Home Management;Gait training;Therapeutic activities;Therapeutic exercise;Balance training;Neuromuscular re-education;Patient/family education   PT Next Visit Plan Nustep; bilateral LE strengthening; balance and gait training.   Consulted and Agree with Plan of Care Patient      Patient will benefit from skilled therapeutic intervention in order to improve the following deficits and impairments:  Abnormal gait, Decreased activity tolerance, Decreased strength, Decreased mobility  Visit Diagnosis: Ataxic gait  Muscle weakness (generalized)     Problem List There are no active problems to display for this patient.   Evelene CroonKelsey M Parsons, PTA 05/20/2016, 12:29 PM  [facility] [address] [place], [place], [address] Phone: [phone]   Fax:  [phone]  Name: Joshua Bright MRN: [id number] Date of Birth: Mar 25, 1927

## 2016-05-26 ENCOUNTER — Ambulatory Visit: Payer: Medicare Other | Admitting: Physical Therapy

## 2016-05-26 DIAGNOSIS — R260 Ataxic gait: Secondary | ICD-10-CM

## 2016-05-26 DIAGNOSIS — M6281 Muscle weakness (generalized): Secondary | ICD-10-CM

## 2016-05-26 NOTE — Therapy (Addendum)
[facility] [place], [place], [address] Phone: [phone]   Fax:  [phone]  Physical Therapy Treatment  Patient Details  Name: Joshua Bright MRN: [id number] Date of Birth: November 02, 1926 No Data Recorded  Encounter Date: 05/26/2016      PT End of Session - 05/26/16 1040    Visit Number 10   Number of Visits 16   Date for PT Re-Evaluation 06/13/16   PT Start Time 1036   PT Stop Time 1115   PT Time Calculation (min) 39 min   Activity Tolerance Patient tolerated treatment well   Behavior During Therapy [facility] for tasks assessed/performed      No past medical history on file.  No past surgical history on file.  There were no vitals filed for this visit.      Subjective Assessment - 05/26/16 1043    Subjective Patient states that he is making improvements overall and his legs feel stronger.   Pertinent History Spinal stenosis; right hip fx.   Limitations Walking   How long can you walk comfortably? 10 minutes.   Patient Stated Goals Walk better and improve balance.   Currently in Pain? No/denies                         [facility] Adult PT Treatment/Exercise - 05/26/16 0001      Knee/Hip Exercises: Aerobic   Nustep L5 x23 min UE/LE activity with good posture     Knee/Hip Exercises: Seated   Long Arc Quad Strengthening;Both;3 sets;10 reps;Weights   Long Arc Quad Weight 5 lbs.   Other Seated Knee/Hip Exercises seated diagonals no wt due to shoulder pain x 20 each side   Sit to Sand 15 reps;without UE support     Knee/Hip Exercises: Supine   Bridges Strengthening;3 sets;10 reps   Bridges with Clamshell Strengthening;3 sets;10 reps  slide board under R foot   Straight Leg Raises Strengthening;Both;10 reps  flex, abd, add then down   Other Supine Knee/Hip Exercises B supine hip abduct/adduct x30 reps                  PT Short Term Goals - 04/23/16 1114      PT SHORT TERM GOAL #1   Title Ind with an initial HEP.   Period Weeks   Status Achieved           PT Long Term Goals - 05/26/16 1244      PT LONG TERM GOAL #1   Title Independent with an advanced HEP.   Time 8   Period Weeks   Status On-going     PT LONG TERM GOAL #2   Title Negative Romberg test.   Time 8   Period Weeks   Status On-going     PT LONG TERM GOAL #3   Title Sit to stand with supervsion only.   Time 8   Period Weeks   Status Achieved     PT LONG TERM GOAL #4   Title Supine to sit with supervision only.   Time 8   Period Weeks   Status Achieved               Plan - 05/26/16 1240    Clinical Impression Statement Patient is reports improvement overall in strength, however he has had one significant fall in the past week. He does well with TE, but requires VCs to perform correctly at times and to perform at  a slower pace. He has met his sit to stand goals. Remaining goals are ongoing.   Rehab Potential Good   Clinical Impairments Affecting Rehab Potential Ataxic gait pattern.   PT Frequency 2x / week   PT Duration 8 weeks   PT Treatment/Interventions ADLs/Self Care Home Management;Gait training;Therapeutic activities;Therapeutic exercise;Balance training;Neuromuscular re-education;Patient/family education   PT Next Visit Plan Nustep; bilateral LE strengthening; balance and gait training.   Consulted and Agree with Plan of Care Patient      Patient will benefit from skilled therapeutic intervention in order to improve the following deficits and impairments:  Abnormal gait, Decreased activity tolerance, Decreased strength, Decreased mobility  Visit Diagnosis: Ataxic gait  Muscle weakness (generalized)       G-Codes - 06/21/16 1246    Functional Assessment Tool Used Clinical judgement.   Functional Limitation Mobility: Walking and moving around   Mobility: Walking and Moving Around Current Status [phone]) At least 20 percent but less than 40 percent impaired, limited  or restricted   Mobility: Walking and Moving Around Goal Status [phone]) At least 1 percent but less than 20 percent impaired, limited or restricted      Problem List There are no active problems to display for this patient.   Madelyn Flavors PT June 21, 2016, 12:47 PM  [facility] [address] [place], [place], [address] Phone: [phone]   Fax:  [phone]  Name: ZURIEL ROSKOS MRN: [id number] Date of Birth: 17-Mar-1927  PHYSICAL THERAPY DISCHARGE SUMMARY  Visits from Start of Care: 10.  Current functional level related to goals / functional outcomes: See above.   Remaining deficits: Continued balance deficit.   Education / Equipment: HEP. Plan: Patient agrees to discharge.  Patient goals were partially met. Patient is being discharged due to not returning since the last visit.  ?????         Mali Applegate MPT

## 2016-05-28 ENCOUNTER — Encounter: Payer: Medicare Other | Admitting: *Deleted

## 2017-03-15 ENCOUNTER — Ambulatory Visit: Payer: Self-pay | Admitting: Physician Assistant

## 2017-03-18 ENCOUNTER — Other Ambulatory Visit (HOSPITAL_COMMUNITY): Payer: Medicare Other

## 2017-03-19 ENCOUNTER — Emergency Department (HOSPITAL_COMMUNITY): Payer: Medicare Other

## 2017-03-19 ENCOUNTER — Encounter (HOSPITAL_COMMUNITY): Payer: Self-pay

## 2017-03-19 ENCOUNTER — Emergency Department (HOSPITAL_COMMUNITY)
Admission: EM | Admit: 2017-03-19 | Discharge: 2017-03-19 | Disposition: A | Discharge status: Home / Self Care | Payer: Medicare Other | Attending: Emergency Medicine | Admitting: Emergency Medicine

## 2017-03-19 ENCOUNTER — Encounter (HOSPITAL_COMMUNITY): Payer: Self-pay | Admitting: Emergency Medicine

## 2017-03-19 ENCOUNTER — Other Ambulatory Visit: Payer: Self-pay

## 2017-03-19 ENCOUNTER — Encounter (HOSPITAL_COMMUNITY)
Admission: RE | Admit: 2017-03-19 | Discharge: 2017-03-19 | Disposition: A | Discharge status: Home / Self Care | Payer: Medicare Other | Source: Ambulatory Visit | Attending: Orthopedic Surgery | Admitting: Orthopedic Surgery

## 2017-03-19 DIAGNOSIS — Z79899 Other long term (current) drug therapy: Secondary | ICD-10-CM | POA: Insufficient documentation

## 2017-03-19 DIAGNOSIS — F0390 Unspecified dementia without behavioral disturbance: Secondary | ICD-10-CM | POA: Insufficient documentation

## 2017-03-19 DIAGNOSIS — I10 Essential (primary) hypertension: Secondary | ICD-10-CM | POA: Insufficient documentation

## 2017-03-19 DIAGNOSIS — Z7982 Long term (current) use of aspirin: Secondary | ICD-10-CM | POA: Diagnosis not present

## 2017-03-19 DIAGNOSIS — I480 Paroxysmal atrial fibrillation: Secondary | ICD-10-CM | POA: Diagnosis not present

## 2017-03-19 DIAGNOSIS — R9431 Abnormal electrocardiogram [ECG] [EKG]: Secondary | ICD-10-CM | POA: Diagnosis present

## 2017-03-19 DIAGNOSIS — R0602 Shortness of breath: Secondary | ICD-10-CM | POA: Diagnosis not present

## 2017-03-19 HISTORY — DX: Spinal stenosis, site unspecified: M48.00

## 2017-03-19 HISTORY — DX: Essential (primary) hypertension: I10

## 2017-03-19 HISTORY — DX: Unspecified dementia, unspecified severity, without behavioral disturbance, psychotic disturbance, mood disturbance, and anxiety: F03.90

## 2017-03-19 HISTORY — DX: Hyperlipidemia, unspecified: E78.5

## 2017-03-19 HISTORY — DX: Major depressive disorder, single episode, unspecified: F32.9

## 2017-03-19 HISTORY — DX: Depression, unspecified: F32.A

## 2017-03-19 HISTORY — DX: Unspecified osteoarthritis, unspecified site: M19.90

## 2017-03-19 LAB — CBC
HCT: 47.1 % (ref 39.0–52.0)
HEMOGLOBIN: 15.1 g/dL (ref 13.0–17.0)
MCH: 28.5 pg (ref 26.0–34.0)
MCHC: 32.1 g/dL (ref 30.0–36.0)
MCV: 89 fL (ref 78.0–100.0)
PLATELETS: 273 10*3/uL (ref 150–400)
RBC: 5.29 MIL/uL (ref 4.22–5.81)
RDW: 13.1 % (ref 11.5–15.5)
WBC: 11.5 10*3/uL — AB (ref 4.0–10.5)

## 2017-03-19 LAB — CBC WITH DIFFERENTIAL/PLATELET
BASOS PCT: 0 %
Basophils Absolute: 0 10*3/uL (ref 0.0–0.1)
EOS ABS: 0.1 10*3/uL (ref 0.0–0.7)
Eosinophils Relative: 1 %
HEMATOCRIT: 43.1 % (ref 39.0–52.0)
HEMOGLOBIN: 13.8 g/dL (ref 13.0–17.0)
LYMPHS ABS: 0.4 10*3/uL — AB (ref 0.7–4.0)
Lymphocytes Relative: 7 %
MCH: 28.6 pg (ref 26.0–34.0)
MCHC: 32 g/dL (ref 30.0–36.0)
MCV: 89.4 fL (ref 78.0–100.0)
MONO ABS: 0 10*3/uL — AB (ref 0.1–1.0)
MONOS PCT: 0 %
NEUTROS ABS: 5.2 10*3/uL (ref 1.7–7.7)
Neutrophils Relative %: 92 %
Platelets: 201 10*3/uL (ref 150–400)
RBC: 4.82 MIL/uL (ref 4.22–5.81)
RDW: 13.1 % (ref 11.5–15.5)
WBC: 5.7 10*3/uL (ref 4.0–10.5)

## 2017-03-19 LAB — SURGICAL PCR SCREEN
MRSA, PCR: NEGATIVE
Staphylococcus aureus: NEGATIVE

## 2017-03-19 LAB — COMPREHENSIVE METABOLIC PANEL
ALBUMIN: 3.7 g/dL (ref 3.5–5.0)
ALK PHOS: 100 U/L (ref 38–126)
ALT: 13 U/L — AB (ref 17–63)
AST: 18 U/L (ref 15–41)
Anion gap: 9 (ref 5–15)
BUN: 18 mg/dL (ref 6–20)
CALCIUM: 9.3 mg/dL (ref 8.9–10.3)
CHLORIDE: 102 mmol/L (ref 101–111)
CO2: 28 mmol/L (ref 22–32)
CREATININE: 1.1 mg/dL (ref 0.61–1.24)
GFR calc Af Amer: >60 mL/min (ref >60)
GFR calc non Af Amer: 58 mL/min — ABNORMAL LOW (ref >60)
GLUCOSE: 101 mg/dL — AB (ref 65–99)
Potassium: 3.9 mmol/L (ref 3.5–5.1)
SODIUM: 139 mmol/L (ref 135–145)
Total Bilirubin: 1 mg/dL (ref 0.3–1.2)
Total Protein: 6.7 g/dL (ref 6.5–8.1)

## 2017-03-19 LAB — BASIC METABOLIC PANEL
ANION GAP: 11 (ref 5–15)
BUN: 18 mg/dL (ref 6–20)
CALCIUM: 9.6 mg/dL (ref 8.9–10.3)
CO2: 24 mmol/L (ref 22–32)
Chloride: 101 mmol/L (ref 101–111)
Creatinine, Ser: 1.03 mg/dL (ref 0.61–1.24)
Glucose, Bld: 100 mg/dL — ABNORMAL HIGH (ref 65–99)
Potassium: 4 mmol/L (ref 3.5–5.1)
SODIUM: 136 mmol/L (ref 135–145)

## 2017-03-19 LAB — BRAIN NATRIURETIC PEPTIDE: B NATRIURETIC PEPTIDE 5: 559.5 pg/mL — AB (ref 0.0–100.0)

## 2017-03-19 LAB — TROPONIN I: Troponin I: <0.03 ng/mL (ref <0.03)

## 2017-03-19 NOTE — ED Triage Notes (Signed)
Pt here from pre op with irregular EKG having screening for back sx completed this am; pt denies sx

## 2017-03-19 NOTE — ED Notes (Signed)
EDP at bedside  

## 2017-03-19 NOTE — Discharge Instructions (Signed)
As discussed, it is important that you follow-up with our cardiology colleagues on Monday afternoon.  If you develop new, or concerning changes in the interim, please return here for further evaluation.

## 2017-03-19 NOTE — ED Notes (Signed)
Patient transported to X-ray 

## 2017-03-19 NOTE — Pre-Procedure Instructions (Signed)
Joshua Bright  03/19/2017      [facility] - [place] - [address] [place] [address] Phone: [phone] Fax: [phone]    Your procedure is scheduled on Wednesday July11 .  [facility] at 6:30 A.M.   Call this number if you have problems the morning of surgery:  [phone]   Remember:  Do not eat food or drink liquids after midnight.   Take these medicines the morning of surgery with A SIP OF WATER Amlodipine(Norvasc),Artificial tears if needed,calcitonin(Miacalcin) nasal spray,donepezil(Aricept),Finasteride(Proscar),Tramadol(Ultram) if needed  STOP ASPIRIN,ANTIINFLAMATORIES (IBUPROFEN,ALEVE,MOTRIN,ADVIL,GOODY'S POWDERS),HERBAL SUPPLEMENTS,FISH OIL,AND VITAMINS 5-7 DAYS PRIOR TO SURGERY   Do not wear jewelry, make-up or nail polish.  Do not wear lotions, powders, or perfumes, or deoderant.  Do not shave 48 hours prior to surgery.  Men may shave face and neck.  Do not bring valuables to the hospital.  [facility] is not responsible for any belongings or valuables.  Contacts, dentures or bridgework may not be worn into surgery.  Leave your suitcase in the car.  After surgery it may be brought to your room.  For patients admitted to the hospital, discharge time will be determined by your treatment team.  Patients discharged the day of surgery will not be allowed to drive home.    Special Instructions: [place] - Preparing for Surgery  Before surgery, you can play an important role.  Because skin is not sterile, your skin needs to be as free of germs as possible.  You can reduce the number of germs on you skin by washing with CHG (chlorahexidine gluconate) soap before surgery.  CHG is an antiseptic cleaner which kills germs and bonds with the skin to continue killing germs even after washing.  Please DO NOT use if you have an allergy to CHG or antibacterial soaps.  If your skin becomes  reddened/irritated stop using the CHG and inform your nurse when you arrive at Short Stay.  Do not shave (including legs and underarms) for at least 48 hours prior to the first CHG shower.  You may shave your face.  Please follow these instructions carefully:   1.  Shower with CHG Soap the night before surgery and the   morning of Surgery.  2.  If you choose to wash your hair, wash your hair first as usual with your normal shampoo.  3.  After you shampoo, rinse your hair and body thoroughly to remove the  Shampoo.  4.  Use CHG as you would any other liquid soap.  You can apply chg directly  to the skin and wash gently with scrungie or a clean washcloth.  5.  Apply the CHG Soap to your body ONLY FROM THE NECK DOWN.   Do not use on open wounds or open sores.  Avoid contact with your eyes,  ears, mouth and genitals (private parts).  Wash genitals (private parts) with your normal soap.  6.  Wash thoroughly, paying special attention to the area where your surgery will be performed.  7.  Thoroughly rinse your body with warm water from the neck down.  8.  DO NOT shower/wash with your normal soap after using and rinsing o  the CHG Soap.  9.  Pat yourself dry with a clean towel.            10.  Wear clean pajamas.            11.  Place clean sheets on your bed the night of your first shower and do not sleep with pets.  Day of Surgery  Do not apply any lotions/deodorants the morning of surgery.  Please wear clean clothes to the hospital/surgery center.   Please read over the following fact sheets that you were given. MRSA Information and Surgical Site Infection Prevention

## 2017-03-19 NOTE — Progress Notes (Addendum)
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 BMI 22  Medications include: Amlodipine, ASA 81 mg, Lipitor, Aricept, telmisartan-HCTZ  EKG 03/19/17: atrial flutter with runs of bigeminy PVCs and intermittent couplet PVCs.  Lateral ST and T wave abnormality.   Called to see pt in PAT for abnormal EKG.  Pt denies sx.  Denies history of atrial flutter but reports history of "skipped beats" .  Reviewed with Dr. Chaney MallingHodierne.  Pt transported to ER for further eval.   Rica Mastngela Maverick Patman, FNP-BC [facility]/Anesthesiology Phone: [phone] 03/19/2017 12:38 PM  Addendum:   Pt evaluated in ED 03/19/17 and discharged home with f/u with cardiology arranged.  Pt saw cardiologist Lance MussJayadeep Varanasi, MD on 03/22/17 and was cleared for surgery at moderate risk of complication due to non-modifiable risk factors age and conduction disease.    He notes: "Avoid rate slowing drugs.  No further cardiac testing needed before surgery. I explained the cardiac risks to the patient and family and they understand.   Complete heart block:  Junctional escape rhythm.  Will arrange follow-up with electrophysiology. Pacemaker will need to be discussed. I discussed the case with Dr. Ladona Ridgelaylor who will see the patient in follow-up. It would be better to have his back surgery completed prior to implantation of permanent pacemaker.   Atrial fibrillation: This is a new diagnosis. Would have to consider anticoagulation carefully after his back procedure. Will arrange for EP follow-up a few weeks after his back procedure."  Preoperative labs reviewed.    CXR 03/19/17:  - Mild interstitial edema with minimal left pleural effusion. Suspect mild  congestive heart failure. Heart upper normal in size.  - There is aortic atherosclerosis.  - Bones osteoporotic.  EKG 03/22/17: AFib, junctional escape rhythm.  If no changes, I anticipate pt can proceed with surgery as scheduled.   Rica Mastngela Jonquil Stubbe, FNP-BC [facility] Short [facility]/Anesthesiology Phone: [phone] 03/23/2017 10:27 AM

## 2017-03-19 NOTE — Progress Notes (Signed)
No history of cardiac problems  Stress test done greater than 10 years ago

## 2017-03-19 NOTE — ED Provider Notes (Signed)
[facility]-EMERGENCY DEPT Provider Note   CSN: [id number] Arrival date & time: 03/19/17  1237     History   Chief Complaint Chief Complaint  Patient presents with  . Abnormal ECG    HPI Joshua Bright is a 83 y.o. male.  HPI  Patient presents with concern of abnormal EKG. Patient has multiple medical issues, but denies history of cardiac disease. Notably, the patient has history of spinal stenosis, was being evaluated for planned surgical procedure next week, when he was found to have an abnormal EKG today. Patient self denies any chest pain, dyspnea, denies any change in his baseline weakness, which is most prominent in his lower extremities. Patient is here to family members who assist with the history of present illness, they state patient has been generally well, has not complained of any chest pain to them, has had no evidence of new weakness, no syncope. Per report, and chart review the patient was at preop eval, was found to have abnormal EKG, and was sent to the emergency department for evaluation.  Patient has poor hearing, but is otherwise awake and alert, participatory.    Past Medical History:  Diagnosis Date  . Arthritis   . Dementia   . Depression   . Hyperlipidemia   . Hypertension   . Spinal stenosis     There are no active problems to display for this patient.   Past Surgical History:  Procedure Laterality Date  . BACK SURGERY    . broken hip Right        Home Medications    Prior to Admission medications   Medication Sig Start Date End Date Taking? Authorizing Provider  ALOE VERA PO Take 5,000 mg by mouth daily.    Yes [provider]  amLODipine (NORVASC) 5 MG tablet Take 5 mg by mouth daily. 08/20/16  Yes [provider]  Artificial Tear Solution (SOOTHE XP OP) Place 1-2 drops into both eyes 3 (three) times daily as needed (for dry eyes.).   Yes [provider]  aspirin 81 MG chewable tablet Chew 81 mg by mouth daily.    Yes [provider]  atorvastatin (LIPITOR) 20 MG tablet Take 20 mg by mouth daily with lunch. 06/22/16  Yes [provider]  calcitonin, salmon, (MIACALCIN) 200 UNIT/ACT nasal spray Place 1 spray into alternate nostrils daily.   Yes [provider]  Cholecalciferol (VITAMIN D3 PO) Take 1 tablet by mouth daily.   Yes [provider]  donepezil (ARICEPT) 10 MG tablet Take 10 mg by mouth daily. 12/09/16 12/09/17 Yes [provider]  finasteride (PROSCAR) 5 MG tablet Take 5 mg by mouth daily. 07/27/16  Yes [provider]  Multiple Vitamin (MULTIVITAMIN WITH MINERALS) TABS tablet Take 1 tablet by mouth daily. One a Day Men's Multivitamin   Yes [provider]  telmisartan-hydrochlorothiazide (MICARDIS HCT) 80-12.5 MG tablet Take 1 tablet by mouth daily. 07/27/16 02/03/18 Yes [provider]  traMADol (ULTRAM) 50 MG tablet Take 50 mg by mouth 2 (two) times daily as needed (for pain. (seldom)).   Yes [provider]    Family History History reviewed. No pertinent family history.  Social History Social History  Substance Use Topics  . Smoking status: Not on file  . Smokeless tobacco: Not on file  . Alcohol use Not on file     Allergies   Patient has no known allergies.   Review of Systems Review of Systems  Constitutional:  Per HPI, otherwise negative  HENT:       Per HPI, otherwise negative  Respiratory:       Per HPI, otherwise negative  Cardiovascular:       Per HPI, otherwise negative  Gastrointestinal: Negative for vomiting.  Endocrine:       Negative aside from HPI  Genitourinary:       Neg aside from HPI   Musculoskeletal:       Per HPI, otherwise negative  Skin: Negative.   Neurological: Positive for weakness. Negative for syncope.       No new weakness  Psychiatric/Behavioral:       Dementia     Physical Exam Updated Vital Signs BP (!) 136/94   Pulse 68   Temp 98 F (36.7  C) (Oral)   Resp (!) 23   SpO2 98%   Physical Exam  Constitutional: He is oriented to person, place, and time. He has a sickly appearance. No distress.  HENT:  Head: Normocephalic and atraumatic.  Eyes: Conjunctivae and EOM are normal.  Cardiovascular: Normal rate and intact distal pulses.  An irregularly irregular rhythm present.  Pulmonary/Chest: Effort normal. No accessory muscle usage or stridor. No tachypnea and no bradypnea. No respiratory distress. He has decreased breath sounds.  Abdominal: He exhibits no distension.  Musculoskeletal: He exhibits no edema.  Neurological: He is alert and oriented to person, place, and time. He displays atrophy. He displays no tremor. He exhibits normal muscle tone. He displays no seizure activity.  Very poor hearing Pain with motion of either lower extremity, but no inability to move either.  Skin: Skin is warm and dry.  Psychiatric: He has a normal mood and affect.  Nursing note and vitals reviewed.    ED Treatments / Results  Labs (all labs ordered are listed, but only abnormal results are displayed) Labs Reviewed  COMPREHENSIVE METABOLIC PANEL - Abnormal; Notable for the following:       Result Value   Glucose, Bld 101 (*)    ALT 13 (*)    GFR calc non Af Amer 58 (*)    All other components within normal limits  CBC WITH DIFFERENTIAL/PLATELET - Abnormal; Notable for the following:    Lymphs Abs 0.4 (*)    Monocytes Absolute 0.0 (*)    All other components within normal limits  BRAIN NATRIURETIC PEPTIDE - Abnormal; Notable for the following:    B Natriuretic Peptide 559.5 (*)    All other components within normal limits  TROPONIN I    EKG with atrial fibrillation, rate 45, for study, artifact, T-wave abnormality, abnormal  Radiology Dg Chest 2 View  Result Date: 03/19/2017 CLINICAL DATA:  Abnormal electrocardiogram.  Hypertension. EXAM: CHEST  2 VIEW COMPARISON:  July 08, 2011 FINDINGS: There is a minimal left pleural  effusion. There is mild interstitial edema. There is no airspace consolidation. Heart is upper normal in size with pulmonary vascularity within normal limits. There is aortic atherosclerosis. No evident adenopathy. Bones are osteoporotic. There is degenerative change in the thoracic spine. IMPRESSION: Mild interstitial edema with minimal left pleural effusion. Suspect mild congestive heart failure. Heart upper normal in size. There is aortic atherosclerosis. Bones osteoporotic. Aortic Atherosclerosis (ICD10-I70.0). Electronically Signed   By: Bretta BangWilliam  Woodruff III M.D.   On: 03/19/2017 14:41    Procedures Procedures (including critical care time)    Initial Impression / Assessment and Plan / ED Course  I have reviewed the triage vital signs and the nursing notes.  Pertinent labs & imaging results that were available during my care of the patient were reviewed by me and considered in my medical decision making (see chart for details).     3:43 PM On repeat exam the patient is in no distress, no tachypnea, tachycardia, no increased work of breathing, no hypoxia. I discussed all findings with patient and his wife. Specifically we discussed the finding of atrial fibrillation, some fluid retention. With no hypoxia, tachypnea, tachycardia, platelets, symptoms, the patient is appropriate for close outpatient follow-up with cardiology for consideration of echocardiogram. With no ongoing ischemia, the patient does not have indication for admission. I discussed this with our cardiology colleagues, and we arranged for outpatient cardiology follow-up in 72 hours. Family aware of the needed to keep this appointment, and the patient discharged in stable condition.  Final Clinical Impressions(s) / ED Diagnoses   Final diagnoses:  None    New Prescriptions New Prescriptions   No medications on file     Gerhard Munch, MD 03/19/17 1544

## 2017-03-21 NOTE — Progress Notes (Signed)
Cardiology Office Note   Date:  03/22/2017   ID:  Joshua Bright, DOB 08-22-1927, MRN [id number]  PCP:  Joette CatchingNyland, Leonard, MD    No chief complaint on file.  Preoperative evaluation  Wt Readings from Last 3 Encounters:  03/19/17 145 lb 1 oz (65.8 kg)       History of Present Illness: Joshua Hammershilip C Ahlin is a 83 y.o. male  Who needs a kyphoplasty.    He had an episode a few days ago where his pulse was difficult to find.  He then recovered to having a normal pulse.    The plan is for him to have the surgery, spend the night and then be discharged the next day.    More than 2 weeks ago, he was using a cane and walker.  He had spinal stenosis surgery 12 years ago.   He had a hip pinning surgery 5 years ago.  Two weeks ago, he fell on his back and now has been in a wheel chair.    No CP or SHOB.  No palpitaitons.      Past Medical History:  Diagnosis Date  . Arthritis   . Dementia   . Depression   . Hyperlipidemia   . Hypertension   . Spinal stenosis     Past Surgical History:  Procedure Laterality Date  . BACK SURGERY    . broken hip Right      Current Outpatient Prescriptions  Medication Sig Dispense Refill  . ALOE VERA PO Take 5,000 mg by mouth daily.     Marland Kitchen. amLODipine (NORVASC) 5 MG tablet Take 5 mg by mouth daily.    . Artificial Tear Solution (SOOTHE XP OP) Place 1-2 drops into both eyes 3 (three) times daily as needed (for dry eyes.).    Marland Kitchen. atorvastatin (LIPITOR) 20 MG tablet Take 20 mg by mouth daily with lunch.    . calcitonin, salmon, (MIACALCIN) 200 UNIT/ACT nasal spray Place 1 spray into alternate nostrils daily.    . Cholecalciferol (VITAMIN D3 PO) Take 1 tablet by mouth daily.    Marland Kitchen. donepezil (ARICEPT) 10 MG tablet Take 10 mg by mouth daily.    . finasteride (PROSCAR) 5 MG tablet Take 5 mg by mouth daily.    . Multiple Vitamin (MULTIVITAMIN WITH MINERALS) TABS tablet Take 1 tablet by mouth daily. One a Day Men's Multivitamin    .  telmisartan-hydrochlorothiazide (MICARDIS HCT) 80-12.5 MG tablet Take 1 tablet by mouth daily.    . traMADol (ULTRAM) 50 MG tablet Take 50 mg by mouth 2 (two) times daily as needed (for pain. (seldom)).    Marland Kitchen. aspirin 81 MG chewable tablet Chew 81 mg by mouth daily.     No current facility-administered medications for this visit.     Allergies:   Patient has no known allergies.    Social History:  The patient  reports that he has never smoked. He has never used smokeless tobacco.   Family History:  The patient'sfamily history includes Heart disease in his brother.     ROS:  Please see the history of present illness.   Otherwise, review of systems are positive for back pain.   All other systems are reviewed and negative.    PHYSICAL EXAM: VS:  BP 130/70   Pulse (!) 47   Ht 5\' 8"  (1.727 m)   SpO2 98%  , BMI There is no height or weight on file to calculate BMI. GEN: Well nourished, well developed,  in no acute distress  HEENT: normal  Neck: no JVD, carotid bruits, or masses Cardiac: RRR; no murmurs, rubs, or gallops,no edema  Respiratory:  clear to auscultation bilaterally, normal work of breathing GI: soft, nontender, nondistended, + BS MS: no deformity or atrophy  Skin: warm and dry, no rash Neuro:  Strength and sensation are intact Psych: euthymic mood, full affect   EKG:   The ekg ordered today demonstrates underlying AFib, junctional escape rhythm.   Recent Labs: 03/19/2017: ALT 13; B Natriuretic Peptide 559.5; BUN 18; Creatinine, Ser 1.10; Hemoglobin 13.8; Platelets 201; Potassium 3.9; Sodium 139   Lipid Panel No results found for: CHOL, TRIG, HDL, CHOLHDL, VLDL, LDLCALC, LDLDIRECT   Other studies Reviewed: Additional studies/ records that were reviewed today with results demonstrating: ECGs from hospital show AFib .   ASSESSMENT AND PLAN:  1. Preoperative eval:  The patient is at moderate risk of cardiac complication. The procedure itself is a lower risk procedure.  The patient's risks are not modifiable at this time as they are related to age and his conduction disease.  Avoid rate slowing drugs.  No further cardiac testing needed before surgery. I explained the cardiac risks to the patient and family and they understand.   2. Complete heart block:  Junctional escape rhythm.  Will arrange follow-up with electrophysiology. Pacemaker will need to be discussed. I discussed the case with Dr. Ladona Ridgel who will see the patient in follow-up. It would be better to have his back surgery completed prior to implantation of permanent pacemaker.   3. Atrial fibrillation: This is a new diagnosis. Would have to consider anticoagulation carefully after his back procedure. Will arrange for EP follow-up a few weeks after his back procedure.  4. HTN: Controlled .  Avoid rate slowing drugs.   Current medicines are reviewed at length with the patient today.  The patient concerns regarding his medicines were addressed.  The following changes have been made:  No change  Labs/ tests ordered today include:  No orders of the defined types were placed in this encounter.   Recommend 150 minutes/week of aerobic exercise Low fat, low carb, high fiber diet recommended  Disposition:   FU with EP in a few weeks.   Signed, Lance Muss, MD  03/22/2017 4:44 PM    [facility] [address] [place], [place], [place]  [address] Phone: [phone]; Fax: [phone]

## 2017-03-22 ENCOUNTER — Encounter: Payer: Self-pay | Admitting: Interventional Cardiology

## 2017-03-22 ENCOUNTER — Ambulatory Visit (INDEPENDENT_AMBULATORY_CARE_PROVIDER_SITE_OTHER): Payer: Medicare Other | Admitting: Interventional Cardiology

## 2017-03-22 VITALS — BP 130/70 | HR 47 | Ht 68.0 in

## 2017-03-22 DIAGNOSIS — Z01810 Encounter for preprocedural cardiovascular examination: Secondary | ICD-10-CM

## 2017-03-22 DIAGNOSIS — I10 Essential (primary) hypertension: Secondary | ICD-10-CM | POA: Diagnosis not present

## 2017-03-22 DIAGNOSIS — I4819 Other persistent atrial fibrillation: Secondary | ICD-10-CM

## 2017-03-22 DIAGNOSIS — I442 Atrioventricular block, complete: Secondary | ICD-10-CM | POA: Diagnosis not present

## 2017-03-22 DIAGNOSIS — I481 Persistent atrial fibrillation: Secondary | ICD-10-CM | POA: Diagnosis not present

## 2017-03-22 NOTE — Patient Instructions (Signed)
Medication Instructions:  Your physician recommends that you continue on your current medications as directed. Please refer to the Current Medication list given to you today.   Labwork: None ordered  Testing/Procedures: None ordered  Follow-Up: Your physician recommends that you schedule a follow-up appointment in: 3-4 weeks with Dr. Ladona Ridgelaylor for Complete Heart Block.   Any Other Special Instructions Will Be Listed Below (If Applicable).     If you need a refill on your cardiac medications before your next appointment, please call your pharmacy.

## 2017-03-24 ENCOUNTER — Ambulatory Visit (HOSPITAL_COMMUNITY): Payer: Medicare Other | Admitting: Certified Registered"

## 2017-03-24 ENCOUNTER — Ambulatory Visit (HOSPITAL_COMMUNITY): Payer: Medicare Other | Admitting: Emergency Medicine

## 2017-03-24 ENCOUNTER — Observation Stay (HOSPITAL_COMMUNITY)
Admission: RE | Admit: 2017-03-24 | Discharge: 2017-03-26 | Disposition: A | Discharge status: Skilled Nursing Facility | Payer: Medicare Other | Source: Ambulatory Visit | Attending: Orthopedic Surgery | Admitting: Orthopedic Surgery

## 2017-03-24 ENCOUNTER — Inpatient Hospital Stay (HOSPITAL_COMMUNITY): Payer: Medicare Other

## 2017-03-24 ENCOUNTER — Encounter (HOSPITAL_COMMUNITY)
Admission: RE | Disposition: A | Discharge status: Skilled Nursing Facility | Payer: Self-pay | Source: Ambulatory Visit | Attending: Orthopedic Surgery

## 2017-03-24 DIAGNOSIS — F0390 Unspecified dementia without behavioral disturbance: Secondary | ICD-10-CM | POA: Insufficient documentation

## 2017-03-24 DIAGNOSIS — R636 Underweight: Secondary | ICD-10-CM | POA: Diagnosis not present

## 2017-03-24 DIAGNOSIS — E785 Hyperlipidemia, unspecified: Secondary | ICD-10-CM | POA: Diagnosis not present

## 2017-03-24 DIAGNOSIS — M4854XA Collapsed vertebra, not elsewhere classified, thoracic region, initial encounter for fracture: Secondary | ICD-10-CM

## 2017-03-24 DIAGNOSIS — W19XXXS Unspecified fall, sequela: Secondary | ICD-10-CM | POA: Diagnosis not present

## 2017-03-24 DIAGNOSIS — Z6822 Body mass index (BMI) 22.0-22.9, adult: Secondary | ICD-10-CM | POA: Insufficient documentation

## 2017-03-24 DIAGNOSIS — M8088XA Other osteoporosis with current pathological fracture, vertebra(e), initial encounter for fracture: Principal | ICD-10-CM | POA: Insufficient documentation

## 2017-03-24 DIAGNOSIS — S22000A Wedge compression fracture of unspecified thoracic vertebra, initial encounter for closed fracture: Secondary | ICD-10-CM | POA: Diagnosis present

## 2017-03-24 DIAGNOSIS — I480 Paroxysmal atrial fibrillation: Secondary | ICD-10-CM | POA: Diagnosis not present

## 2017-03-24 DIAGNOSIS — Z419 Encounter for procedure for purposes other than remedying health state, unspecified: Secondary | ICD-10-CM

## 2017-03-24 DIAGNOSIS — W19XXXA Unspecified fall, initial encounter: Secondary | ICD-10-CM | POA: Diagnosis present

## 2017-03-24 DIAGNOSIS — Z7982 Long term (current) use of aspirin: Secondary | ICD-10-CM | POA: Diagnosis not present

## 2017-03-24 DIAGNOSIS — I4510 Unspecified right bundle-branch block: Secondary | ICD-10-CM | POA: Diagnosis not present

## 2017-03-24 DIAGNOSIS — L8990 Pressure ulcer of unspecified site, unspecified stage: Secondary | ICD-10-CM | POA: Insufficient documentation

## 2017-03-24 DIAGNOSIS — R5381 Other malaise: Secondary | ICD-10-CM | POA: Diagnosis not present

## 2017-03-24 DIAGNOSIS — I4891 Unspecified atrial fibrillation: Secondary | ICD-10-CM | POA: Diagnosis not present

## 2017-03-24 DIAGNOSIS — I10 Essential (primary) hypertension: Secondary | ICD-10-CM | POA: Diagnosis not present

## 2017-03-24 DIAGNOSIS — Z79899 Other long term (current) drug therapy: Secondary | ICD-10-CM | POA: Insufficient documentation

## 2017-03-24 HISTORY — PX: KYPHOPLASTY: SHX5884

## 2017-03-24 LAB — PREALBUMIN: PREALBUMIN: 10.9 mg/dL — AB (ref 18–38)

## 2017-03-24 SURGERY — KYPHOPLASTY
Anesthesia: General | Site: Back

## 2017-03-24 MED ORDER — LIDOCAINE HCL (CARDIAC) 20 MG/ML IV SOLN
INTRAVENOUS | Status: AC
  Filled 2017-03-24: qty 5

## 2017-03-24 MED ORDER — AMLODIPINE BESYLATE 5 MG PO TABS
5.0000 mg | ORAL_TABLET | Freq: Every day | ORAL | Status: DC
Start: 2017-03-25 — End: 2017-03-26
  Administered 2017-03-25 – 2017-03-26 (×2): 5 mg via ORAL
  Filled 2017-03-24 (×2): qty 1

## 2017-03-24 MED ORDER — CEFAZOLIN SODIUM-DEXTROSE 2-3 GM-% IV SOLR
INTRAVENOUS | Status: DC | PRN
  Administered 2017-03-24: 2 g via INTRAVENOUS

## 2017-03-24 MED ORDER — FENTANYL CITRATE (PF) 250 MCG/5ML IJ SOLN
INTRAMUSCULAR | Status: AC
  Filled 2017-03-24: qty 5

## 2017-03-24 MED ORDER — BUPIVACAINE-EPINEPHRINE (PF) 0.25% -1:200000 IJ SOLN
INTRAMUSCULAR | Status: AC
  Filled 2017-03-24: qty 30

## 2017-03-24 MED ORDER — ONDANSETRON HCL 4 MG PO TABS: 4.0000 mg | ORAL_TABLET | Freq: Four times a day (QID) | ORAL | Status: DC | PRN

## 2017-03-24 MED ORDER — ONDANSETRON HCL 4 MG/2ML IJ SOLN
INTRAMUSCULAR | Status: DC | PRN
  Administered 2017-03-24: 4 mg via INTRAVENOUS

## 2017-03-24 MED ORDER — PROPOFOL 500 MG/50ML IV EMUL
INTRAVENOUS | Status: DC | PRN
  Administered 2017-03-24: 20 ug/kg/min via INTRAVENOUS

## 2017-03-24 MED ORDER — SODIUM CHLORIDE 0.9% FLUSH
3.0000 mL | Freq: Two times a day (BID) | INTRAVENOUS | Status: DC
  Administered 2017-03-25 – 2017-03-26 (×2): 3 mL via INTRAVENOUS

## 2017-03-24 MED ORDER — ONDANSETRON HCL 4 MG/2ML IJ SOLN: 4.0000 mg | Freq: Four times a day (QID) | INTRAMUSCULAR | Status: DC | PRN

## 2017-03-24 MED ORDER — MENTHOL 3 MG MT LOZG: 1.0000 | LOZENGE | OROMUCOSAL | Status: DC | PRN

## 2017-03-24 MED ORDER — SODIUM CHLORIDE 0.9% FLUSH: 3.0000 mL | INTRAVENOUS | Status: DC | PRN

## 2017-03-24 MED ORDER — ATORVASTATIN CALCIUM 20 MG PO TABS
20.0000 mg | ORAL_TABLET | Freq: Every day | ORAL | Status: DC
  Administered 2017-03-24 – 2017-03-26 (×3): 20 mg via ORAL
  Filled 2017-03-24 (×2): qty 1
  Filled 2017-03-24: qty 2
  Filled 2017-03-24: qty 1
  Filled 2017-03-24: qty 2

## 2017-03-24 MED ORDER — PHENOL 1.4 % MT LIQD: 1.0000 | OROMUCOSAL | Status: DC | PRN

## 2017-03-24 MED ORDER — ACETAMINOPHEN 325 MG PO TABS
650.0000 mg | ORAL_TABLET | ORAL | Status: DC | PRN
  Administered 2017-03-24 – 2017-03-26 (×6): 650 mg via ORAL
  Filled 2017-03-24 (×6): qty 2

## 2017-03-24 MED ORDER — CEFAZOLIN SODIUM-DEXTROSE 1-4 GM/50ML-% IV SOLN
1.0000 g | Freq: Three times a day (TID) | INTRAVENOUS | Status: AC
  Administered 2017-03-24 – 2017-03-25 (×2): 1 g via INTRAVENOUS
  Filled 2017-03-24 (×2): qty 50

## 2017-03-24 MED ORDER — PHENYLEPHRINE HCL 10 MG/ML IJ SOLN
INTRAMUSCULAR | Status: DC | PRN
  Administered 2017-03-24 (×2): 40 ug via INTRAVENOUS
  Administered 2017-03-24 (×3): 80 ug via INTRAVENOUS

## 2017-03-24 MED ORDER — ROCURONIUM BROMIDE 50 MG/5ML IV SOLN
INTRAVENOUS | Status: AC
  Filled 2017-03-24: qty 1

## 2017-03-24 MED ORDER — FENTANYL CITRATE (PF) 100 MCG/2ML IJ SOLN: 25.0000 ug | INTRAMUSCULAR | Status: DC | PRN

## 2017-03-24 MED ORDER — SUCCINYLCHOLINE CHLORIDE 200 MG/10ML IV SOSY
PREFILLED_SYRINGE | INTRAVENOUS | Status: AC
  Filled 2017-03-24: qty 10

## 2017-03-24 MED ORDER — DONEPEZIL HCL 10 MG PO TABS
10.0000 mg | ORAL_TABLET | Freq: Every day | ORAL | Status: DC
  Administered 2017-03-25 – 2017-03-26 (×2): 10 mg via ORAL
  Filled 2017-03-24 (×2): qty 1

## 2017-03-24 MED ORDER — DEXMEDETOMIDINE HCL IN NACL 200 MCG/50ML IV SOLN
INTRAVENOUS | Status: DC | PRN
  Administered 2017-03-24 (×3): 8 ug via INTRAVENOUS
  Administered 2017-03-24: 4 ug via INTRAVENOUS
  Administered 2017-03-24: 8 ug via INTRAVENOUS

## 2017-03-24 MED ORDER — METHOCARBAMOL 500 MG PO TABS
500.0000 mg | ORAL_TABLET | Freq: Four times a day (QID) | ORAL | Status: DC | PRN
  Administered 2017-03-24 – 2017-03-26 (×7): 500 mg via ORAL
  Filled 2017-03-24 (×7): qty 1

## 2017-03-24 MED ORDER — POLYVINYL ALCOHOL 1.4 % OP SOLN
Freq: Three times a day (TID) | OPHTHALMIC | Status: DC | PRN
  Filled 2017-03-24: qty 15

## 2017-03-24 MED ORDER — DEXAMETHASONE SODIUM PHOSPHATE 10 MG/ML IJ SOLN
INTRAMUSCULAR | Status: DC | PRN
  Administered 2017-03-24: 8 mg via INTRAVENOUS

## 2017-03-24 MED ORDER — BUPIVACAINE LIPOSOME 1.3 % IJ SUSP
20.0000 mL | INTRAMUSCULAR | Status: AC
  Administered 2017-03-24: 20 mL
  Filled 2017-03-24: qty 20

## 2017-03-24 MED ORDER — LACTATED RINGERS IV SOLN
INTRAVENOUS | Status: DC
  Administered 2017-03-24: 18:00:00 via INTRAVENOUS

## 2017-03-24 MED ORDER — EPHEDRINE SULFATE 50 MG/ML IJ SOLN
INTRAMUSCULAR | Status: DC | PRN
  Administered 2017-03-24: 10 mg via INTRAVENOUS
  Administered 2017-03-24 (×2): 5 mg via INTRAVENOUS

## 2017-03-24 MED ORDER — FINASTERIDE 5 MG PO TABS
5.0000 mg | ORAL_TABLET | Freq: Every day | ORAL | Status: DC
  Administered 2017-03-25 – 2017-03-26 (×2): 5 mg via ORAL
  Filled 2017-03-24 (×2): qty 1

## 2017-03-24 MED ORDER — BUPIVACAINE-EPINEPHRINE 0.25% -1:200000 IJ SOLN
INTRAMUSCULAR | Status: DC | PRN
  Administered 2017-03-24: 30 mL

## 2017-03-24 MED ORDER — LACTATED RINGERS IV SOLN
INTRAVENOUS | Status: DC | PRN
  Administered 2017-03-24: 08:00:00 via INTRAVENOUS

## 2017-03-24 MED ORDER — ACETAMINOPHEN 650 MG RE SUPP: 650.0000 mg | RECTAL | Status: DC | PRN

## 2017-03-24 MED ORDER — ENSURE ENLIVE PO LIQD
237.0000 mL | Freq: Two times a day (BID) | ORAL | Status: DC
  Administered 2017-03-24 – 2017-03-26 (×4): 237 mL via ORAL
  Filled 2017-03-24 (×7): qty 237

## 2017-03-24 MED ORDER — CEFAZOLIN SODIUM-DEXTROSE 2-4 GM/100ML-% IV SOLN
INTRAVENOUS | Status: AC
  Filled 2017-03-24: qty 100

## 2017-03-24 MED ORDER — PHENYLEPHRINE 40 MCG/ML (10ML) SYRINGE FOR IV PUSH (FOR BLOOD PRESSURE SUPPORT)
PREFILLED_SYRINGE | INTRAVENOUS | Status: AC
  Filled 2017-03-24: qty 10

## 2017-03-24 MED ORDER — GLYCOPYRROLATE 0.2 MG/ML IJ SOLN
INTRAMUSCULAR | Status: DC | PRN
  Administered 2017-03-24: 0.2 mg via INTRAVENOUS

## 2017-03-24 MED ORDER — FENTANYL CITRATE (PF) 100 MCG/2ML IJ SOLN
INTRAMUSCULAR | Status: DC | PRN
  Administered 2017-03-24 (×2): 25 ug via INTRAVENOUS

## 2017-03-24 MED ORDER — PROPOFOL 10 MG/ML IV BOLUS
INTRAVENOUS | Status: AC
  Filled 2017-03-24: qty 20

## 2017-03-24 MED ORDER — IOPAMIDOL (ISOVUE-300) INJECTION 61%
INTRAVENOUS | Status: DC | PRN
  Administered 2017-03-24: 50 mL via INTRAVENOUS

## 2017-03-24 MED ORDER — LIDOCAINE HCL (CARDIAC) 20 MG/ML IV SOLN
INTRAVENOUS | Status: DC | PRN
  Administered 2017-03-24: 50 mg via INTRAVENOUS

## 2017-03-24 MED ORDER — SODIUM CHLORIDE 0.9 % IV SOLN: 250.0000 mL | INTRAVENOUS | Status: DC

## 2017-03-24 MED ORDER — MEPERIDINE HCL 25 MG/ML IJ SOLN: 6.2500 mg | INTRAMUSCULAR | Status: DC | PRN

## 2017-03-24 MED ORDER — TELMISARTAN-HCTZ 80-12.5 MG PO TABS: 1.0000 | ORAL_TABLET | Freq: Every day | ORAL | Status: DC

## 2017-03-24 MED ORDER — METHOCARBAMOL 1000 MG/10ML IJ SOLN
500.0000 mg | Freq: Four times a day (QID) | INTRAMUSCULAR | Status: DC | PRN
  Filled 2017-03-24: qty 5

## 2017-03-24 MED ORDER — IOPAMIDOL (ISOVUE-300) INJECTION 61%
INTRAVENOUS | Status: AC
  Filled 2017-03-24: qty 50

## 2017-03-24 SURGICAL SUPPLY — 50 items
ADH SKN CLS LQ APL DERMABOND (GAUZE/BANDAGES/DRESSINGS) ×1
BANDAGE ADH SHEER 1  50/CT (GAUZE/BANDAGES/DRESSINGS) ×5 IMPLANT
BLADE SURG 15 STRL LF DISP TIS (BLADE) ×1 IMPLANT
BLADE SURG 15 STRL SS (BLADE) ×3
BONE FILLER DEVICE STRL SZ3 (INSTRUMENTS) IMPLANT
CEMENT BONE KYPHX HV R (Orthopedic Implant) ×4 IMPLANT
COVER MAYO STAND STRL (DRAPES) ×3 IMPLANT
CURETTE WEDGE 8.5MM KYPHX (MISCELLANEOUS) ×3 IMPLANT
DERMABOND ADHESIVE PROPEN (GAUZE/BANDAGES/DRESSINGS) ×2
DERMABOND ADVANCED .7 DNX6 (GAUZE/BANDAGES/DRESSINGS) IMPLANT
DRAPE C-ARM 42X72 X-RAY (DRAPES) ×6 IMPLANT
DRAPE HALF SHEET 40X57 (DRAPES) ×6 IMPLANT
DRAPE INCISE IOBAN 66X45 STRL (DRAPES) ×3 IMPLANT
DRAPE LAPAROTOMY T 102X78X121 (DRAPES) ×3 IMPLANT
DRAPE SURG 17X23 STRL (DRAPES) ×3 IMPLANT
DRAPE U-SHAPE 47X51 STRL (DRAPES) ×3 IMPLANT
DRSG AQUACEL AG ADV 3.5X10 (GAUZE/BANDAGES/DRESSINGS) ×3 IMPLANT
DURAPREP 26ML APPLICATOR (WOUND CARE) ×3 IMPLANT
ELECT PENCIL ROCKER SW 15FT (MISCELLANEOUS) ×3 IMPLANT
GAUZE SPONGE 4X4 16PLY XRAY LF (GAUZE/BANDAGES/DRESSINGS) ×3 IMPLANT
GLOVE BIO SURGEON STRL SZ 6.5 (GLOVE) ×2 IMPLANT
GLOVE BIO SURGEONS STRL SZ 6.5 (GLOVE) ×1
GLOVE BIOGEL PI IND STRL 6.5 (GLOVE) ×3 IMPLANT
GLOVE BIOGEL PI INDICATOR 6.5 (GLOVE) ×6
GLOVE SS BIOGEL STRL SZ 8.5 (GLOVE) ×2 IMPLANT
GLOVE SUPERSENSE BIOGEL SZ 8.5 (GLOVE) ×4
GLOVE SURG SS PI 6.5 STRL IVOR (GLOVE) ×4 IMPLANT
GOWN STRL REUS W/ TWL LRG LVL3 (GOWN DISPOSABLE) ×2 IMPLANT
GOWN STRL REUS W/TWL 2XL LVL3 (GOWN DISPOSABLE) ×3 IMPLANT
GOWN STRL REUS W/TWL LRG LVL3 (GOWN DISPOSABLE) ×6
KIT BASIN OR (CUSTOM PROCEDURE TRAY) ×3 IMPLANT
KIT ROOM TURNOVER OR (KITS) ×3 IMPLANT
MIXER KYPHON (MISCELLANEOUS) ×4 IMPLANT
NDL HYPO 25X1 1.5 SAFETY (NEEDLE) ×1 IMPLANT
NDL SPNL 18GX3.5 QUINCKE PK (NEEDLE) ×2 IMPLANT
NEEDLE HYPO 25X1 1.5 SAFETY (NEEDLE) ×3 IMPLANT
NEEDLE SPNL 18GX3.5 QUINCKE PK (NEEDLE) ×6 IMPLANT
NS IRRIG 1000ML POUR BTL (IV SOLUTION) ×3 IMPLANT
PACK SURGICAL SETUP 50X90 (CUSTOM PROCEDURE TRAY) ×3 IMPLANT
PACK UNIVERSAL I (CUSTOM PROCEDURE TRAY) ×3 IMPLANT
PAD ARMBOARD 7.5X6 YLW CONV (MISCELLANEOUS) ×6 IMPLANT
SURGIFLO W/THROMBIN 8M KIT (HEMOSTASIS) IMPLANT
SUT BONE WAX W31G (SUTURE) ×3 IMPLANT
SUT MNCRL AB 3-0 PS2 18 (SUTURE) ×3 IMPLANT
SYR CONTROL 10ML LL (SYRINGE) ×3 IMPLANT
TOWEL OR 17X24 6PK STRL BLUE (TOWEL DISPOSABLE) ×3 IMPLANT
TOWEL OR 17X26 10 PK STRL BLUE (TOWEL DISPOSABLE) ×3 IMPLANT
TRAY KYPHOPAK 15/3 ONESTEP 1ST (MISCELLANEOUS) ×2 IMPLANT
TRAY KYPHOPAK 20/3 ONESTEP 1ST (MISCELLANEOUS) IMPLANT
WATER STERILE IRR 1000ML POUR (IV SOLUTION) ×3 IMPLANT

## 2017-03-24 NOTE — H&P (Signed)
History of Present Illness (Robin C. Young; 03/05/2017 12:32 PM) The patient is a 83 year old male who presents with back complaints. The patient is seen on 03/05/17 with significant pain and has decompensated significantly.  He no longer ambulates and is unable to care for himself.  Currently living at home with his wife who is unable to assist with ADL's.  The patient reports pain (low back) involving the low back which began 03/04/17 following a specific injury. The injury occurred due to a fall (landed on the left hip) while the patient was at home. The patient describes their pain initially as sharp and aching rating it as 8 / 10 on a ten-point analog pain scale and has progressed. The symptoms occur constantly. Symptoms were exacerbated by walking (can not stand to walk for some time).  Currently can't ambulate due to pain and deconditioning. The patient indicates that their symptoms are relieved with restricted activity, lying down and Ibuprofen. Associated symptoms include leg weakness. Current treatment includes activity modification (using a wheelchair). This problem has not been previously evaluated.   Problem List/Past Medical  Pain, Lumbar (LBP) (724.2)  Fracture, Femoral Neck, closed (820.8)  Primary osteoarthritis of one knee (M17.10)  Pain, Hip (719.45)  Problems Reconciled   Allergies  No Known Drug Allergies  Allergies Reconciled   Family History  Heart Disease  father Heart disease in male family member before age 75   Social History  Alcohol use  Never consumed alcohol. never consumed alcohol Children  3 Current work status  retired Financial planner (Currently)  no Drug/Alcohol Rehab (Previously)  no Exercise  Exercises daily; does running / walking and other Illicit drug use  no Living situation  live with spouse Marital status  married Pain Contract  no Tobacco / smoke exposure  no Tobacco use  Never smoker. never smoker  Medication History   Micardis HCT (40-12.5MG  Tablet, Oral) Active. AmLODIPine Besylate (5MG  Tablet, Oral) Active. Finasteride (5MG  Tablet, Oral) Active. Atorvastatin Calcium (20MG  Tablet, Oral) Active. Donepezil HCl (10MG  Tablet, Oral) Active. Medications Reconciled  Past Surgical History Cataract Surgery  left Hip Fracture and Surgery  right  Other Problems  High blood pressure  Hypercholesterolemia   Vitals 03/05/2017 12:24 PM Weight: 145 lb Height: 68in Weight was reported by patient. Body Surface Area: 1.78 m Body Mass Index: 22.05 kg/m  patient in wheelchair/rcy  General General Appearance-Not in acute distress. Orientation-Oriented X3. Build & Nutrition-Well nourished and Well developed.  Integumentary General Characteristics Surgical Scars - no surgical scar evidence of previous lumbar surgery. Lumbar Spine-Skin examination of the lumbar spine is without deformity, skin lesions, lacerations or abrasions. Positive stage 1/2 decubitus ulcer noted  Abdomen Palpation/Percussion Palpation and Percussion of the abdomen reveal - Soft, Non Tender and No Rebound tenderness.  Peripheral Vascular Lower Extremity Palpation - Posterior tibial pulse - Bilateral -1+. Dorsalis pedis pulse - Bilateral - 1+.  Neurologic Sensation Lower Extremity - Bilateral - sensation is intact in the lower extremity. Reflexes Patellar Reflex - Bilateral - 2+. Achilles Reflex - Bilateral - 2+. Testing Seated Straight Leg Raise - Bilateral - Seated straight leg raise negative.  Musculoskeletal Spine/Ribs/Pelvis  Lumbosacral Spine: Inspection and Palpation - Tenderness - left lumbar paraspinals tender to palpation and right lumbar paraspinals tender to palpation. Strength and Tone: Strength - 3/5 motor throughout in the LE ( poor muscle tone and strength)  Sensation intact to LT throughout.  ROM - Flexion - moderately decreased range of motion and painful.Lumbosacral Spine -  Waddell's  Signs - no Waddell's signs present. Lower Extremity Range of Motion - No true hip, knee or ankle pain with range of motion.  Assessments Transcription  This is a very pleasant elderly gentleman with multiple medical comorbidities. According to the cardiologist he has significant perioperative risk. However the patient, and his family are aware. Back pain has significantly progressed since his initial evaluation with me. His overall quality of life is essentially nonexistent. He can no longer tolerate standing and ambulating and assistant significantly decompensated since his visit with me 3 weeks ago. He has developed a early-stage decubitus ulcer. And his wife is no longer able to care for him.  At this time after discussing the risks of surgery we've elected to proceed with the L1 kyphoplasty in an attempt to diminish his pain and ultimately improve his quality of life. Risks include infection, bleeding, death, stroke, paralysis, leak of cement, ongoing or worse pain, need for additional surgery.  To minimize the potential risks and we will plan on doing surgery with IV sedation and local anesthesia. I did inform family that if the fracture has progressed that I will have to abort the procedure.  At this point because of his decompensation I do not think he's able to return to home. His wife is no longer able to care for him. As a result we will plan on admission and ultimately discharged to a skilled nursing facility. I will plan hospitalist consultation. To help maximize his medical recovery from the surgical procedure.

## 2017-03-24 NOTE — Anesthesia Postprocedure Evaluation (Signed)
Anesthesia Post Note  Patient: Joshua Bright  Procedure(s) Performed: Procedure(s) (LRB): L1 Kyphoplasty  and T-11 Kyphoplasty (N/A)     Patient location during evaluation: PACU Anesthesia Type: General Level of consciousness: awake and alert Pain management: pain level controlled Vital Signs Assessment: post-procedure vital signs reviewed and stable Respiratory status: spontaneous breathing, nonlabored ventilation, respiratory function stable and patient connected to nasal cannula oxygen Cardiovascular status: blood pressure returned to baseline and stable Postop Assessment: no signs of nausea or vomiting Anesthetic complications: no    Last Vitals:  Vitals:   03/24/17 1118 03/24/17 1130  BP:    Pulse:  (!) 59  Resp:  20  Temp: 36.8 C     Last Pain:  Vitals:   03/24/17 1218  TempSrc:   PainSc: Asleep                 Milayna Rotenberg

## 2017-03-24 NOTE — Op Note (Signed)
Operative note.  Preoperative diagnosis. L1 osteoporotic compression fracture.  Postoperative diagnosis L1, T11 osteoporotic compression fractures.  Operative procedure. L1 and T11 kyphoplasty.  Complications. None.  Indications. 83 year old gentleman with significant back pain after a fall tonight after 3 weeks ago. Presented to my office with L1 osteoporotic compression fracture. After discussing treatment options and obtaining clearance we elected to proceed with surgery. The patient has significantly decompensated since his original visit with me. He is no longer ambulating and has horrific increase in overall pain.  Operative note. Patient was brought the operating room. IV pain medication was provided and he was turned prone onto gel and pelvic roll. X-ray was done and the L1 fracture noted to be unchanged. However the T11 fracture had progressed. Because of his significant increase in pain I elected to move forward with the T11 kyphoplasty in addition to the planned L1 kyphoplasty. At this time I called out to the patient's wife and son and explained situation. They provided me with verbal consent to move forward with the addition procedure. At this point the back prepped and draped in standard fashion. A timeout was taken to confirm patient procedure and all other important data. IV sedation was provided by the anesthesiologist. And I planned on moving forward with local anesthesia and this consisted of Marcaine, and exparel. Biplane fluoroscopy was sterilely brought into the field. Using the AP and lateral planes identified the fractures. I anesthetized the incision site. I made a small stab incision and then advanced the Jamshidi needle down to the lateral aspect of the L1 pedicle. I then advanced the Jamshidi needle into the pedicle of L1 and ultimately into the vertebral body of L1. I confirm trajectory and position at all times using the AP, and lateral fluoroscopy views. I repeated this on  the contralateral side. I then placed a drill and then the Kyphon inflatable bone tamp. I inflated the bone tamp to the appropriate level. I then removed the bone tamp and inserted the cement. Approximately 2.5 ccof cement was placed into each side of the L1 vertebral body. At no point was there any leak of cement. Once the cement was allowed to cure I removed the Jamshidi trocar. I then repeated this exact same procedure T11 vertebral body level. Again trajectory and position of Jamshidi needle was confirmed using both the AP and lateral fluoroscopy images simultaneously. At this level I was able to place approximately 3 mL of cement on each side. Again there was no evidence of leak of cement at the conclusion of the procedure. Once both levels were completely I closed each of the small stab incisions with a 3-0 Monocryl suture. I then placed Dermabond and a Band-Aid. Again all incisions were anesthetized with Marcaine and exparel.    Patient was ultimately extubated and transferred the PACU without incident. At the end of the case all needle sponge counts were correct. The patient actually tolerated the procedure quite well.

## 2017-03-24 NOTE — Consult Note (Signed)
Consultation Note   Joshua Bright Coach VWU:[id number] DOB: 1927-03-20 DOA: 03/24/2017   PCP: Joette CatchingNyland, Leonard, MD   Patient coming from/Resides with: Private residence/lives with wife  Requesting physician: Brooks/ orthopedics  Consulting physician: Merrell/ Internal Medicine  Reason for consultation: Initiate process for SNF placement and assist with management of medical problems  HPI: Joshua Bright Levels is a 83 y.o. male with medical history significant for dementia, hypertension, dyslipidemia, spinal stenosis and arthritis. 2 or 3 weeks prior to presentation for surgical intervention patient had a fall at home landing on his back and has been unable to mobilize and has been restricted to a wheelchair. Outpatient imaging revealed L1 and T11 compression fractures and patient was from to the operative arena today by orthopedic surgery to pursue kyphoplasty. Patient did receive outpatient cardiology clearance by Dr. Eldridge DaceVaranasi. Of note, at that visit patient was found to be in new onset atrial fibrillation with associated bradycardic rate but given need for surgical intervention has not yet been started on anticoagulation. Dr. Shon BatonBrooks reports that patient's wife is unable to manage him at home any longer and patient will likely need to be placed in a skilled nursing facility once medically stable for discharge. Of note, patient reports pain has improved in the immediate postop period.  Patient has no significant internal medicine needs at this juncture. Cardiology to also see patient during hospitalization and can manage cardiology problems as described below. Nutrition has been consulted and protein supplements have been ordered. PT/OT consulted in anticipation of discharge to SNF bed. SW asked to begin bed search. We will sign off at this juncture but please call if new internal medicine issues arise.   Review of Systems:  **Unable to obtain from patient given history of dementia and he is quite  hard of hearing and having difficulty answering questions when asked   Past Medical History:  Diagnosis Date  . Arthritis   . Dementia   . Depression   . Hyperlipidemia   . Hypertension   . Spinal stenosis     Past Surgical History:  Procedure Laterality Date  . BACK SURGERY    . broken hip Right     Social History   Social History  . Marital status: Married    Spouse name: N/A  . Number of children: N/A  . Years of education: N/A   Occupational History  . Not on file.   Social History Main Topics  . Smoking status: Never Smoker  . Smokeless tobacco: Never Used  . Alcohol use Not on file  . Drug use: Unknown  . Sexual activity: Not on file   Other Topics Concern  . Not on file   Social History Narrative  . No narrative on file    Mobility: Wheelchair as described above Work history: N/A   Allergies  Allergen Reactions  . No Known Allergies     Family History  Problem Relation Age of Onset  . Heart disease Brother      Prior to Admission medications   Medication Sig Start Date End Date Taking? Authorizing Provider  ALOE VERA PO Take 5,000 mg by mouth daily.    Yes [provider]  amLODipine (NORVASC) 5 MG tablet Take 5 mg by mouth daily. 08/20/16  Yes [provider]  Artificial Tear Solution (SOOTHE XP OP) Place 1-2 drops into both eyes 3 (three) times daily as needed (for dry eyes.).   Yes [provider]  aspirin 81 MG chewable tablet Chew 81  mg by mouth daily.   Yes [provider]  atorvastatin (LIPITOR) 20 MG tablet Take 20 mg by mouth daily with lunch. 06/22/16  Yes [provider]  calcitonin, salmon, (MIACALCIN) 200 UNIT/ACT nasal spray Place 1 spray into alternate nostrils daily.   Yes [provider]  Cholecalciferol (VITAMIN D3 PO) Take 1 tablet by mouth daily.   Yes [provider]  donepezil (ARICEPT) 10 MG tablet Take 10 mg by mouth daily. 12/09/16 12/09/17 Yes [provider]  finasteride (PROSCAR) 5 MG tablet Take 5 mg by mouth daily. 07/27/16  Yes [provider]  Multiple Vitamin (MULTIVITAMIN WITH MINERALS) TABS tablet Take 1 tablet by mouth daily. One a Day Men's Multivitamin   Yes [provider]  telmisartan-hydrochlorothiazide (MICARDIS HCT) 80-12.5 MG tablet Take 1 tablet by mouth daily. 07/27/16 02/03/18 Yes [provider]  traMADol (ULTRAM) 50 MG tablet Take 50 mg by mouth 2 (two) times daily as needed (for pain. (seldom)).   Yes [provider]    Physical Exam: Vitals:   03/24/17 0733 03/24/17 1000 03/24/17 1015 03/24/17 1030  BP: (!) 183/79 116/67 120/62 116/66  Pulse: 84 76 64 72  Resp: 20 19 13 18   Temp: 98.9 F (37.2 Bright) (!) 97.5 F (36.4 Bright)    TempSrc: Oral     SpO2: 94% 94% 99% 97%  Weight: 65.8 kg (145 lb)         Constitutional: NAD, calm, Appears to be comfortable in PACU Eyes: PERRL, lids and conjunctivae normal ENMT: Mucous membranes are dry. Posterior pharynx clear of any exudate or lesions.Normal dentition.  Neck: normal, supple, no masses, no thyromegaly Respiratory: clear to auscultation bilaterally, no wheezing, no crackles. Normal respiratory effort. No accessory muscle use.  Cardiovascular: Regular rate and rhythm, no murmurs / rubs / gallops. No extremity edema. 2+ pedal pulses. No carotid bruits.  Abdomen: no tenderness, no masses palpated. No hepatosplenomegaly. Bowel sounds positive.  Musculoskeletal: no clubbing / cyanosis. No joint deformity upper and lower extremities. Good ROM, no contractures. Normal muscle tone.  Skin: no rashes, lesions, ulcers. No induration Neurologic: CN 2-12 grossly intact. Sensation intact, DTR normal. Strength 5/5 x all 4 extremities.  Psychiatric: Alert and oriented x name. Exam limited by patient's hearing loss and recent receipt of medications for surgical procedure. Normal mood.    Labs on Admission: I have personally reviewed  following labs and imaging studies  CBC:  Recent Labs Lab 03/19/17 1148 03/19/17 1359  WBC 11.5* 5.7  NEUTROABS  --  5.2  HGB 15.1 13.8  HCT 47.1 43.1  MCV 89.0 89.4  PLT 273 201   Basic Metabolic Panel:  Recent Labs Lab 03/19/17 1148 03/19/17 1359  NA 136 139  K 4.0 3.9  CL 101 102  CO2 24 28  GLUCOSE 100* 101*  BUN 18 18  CREATININE 1.03 1.10  CALCIUM 9.6 9.3   GFR: Estimated Creatinine Clearance: 43.2 mL/min (by Bright-G formula based on SCr of 1.1 mg/dL). Liver Function Tests:  Recent Labs Lab 03/19/17 1359  AST 18  ALT 13*  ALKPHOS 100  BILITOT 1.0  PROT 6.7  ALBUMIN 3.7   No results for input(s): LIPASE, AMYLASE in the last 168 hours. No results for input(s): AMMONIA in the last 168 hours. Coagulation Profile: No results for input(s): INR, PROTIME in the last 168 hours. Cardiac Enzymes:  Recent Labs Lab 03/19/17 1359  TROPONINI <0.03   BNP (last 3 results) No results for input(s): PROBNP  in the last 8760 hours. HbA1C: No results for input(s): HGBA1C in the last 72 hours. CBG: No results for input(s): GLUCAP in the last 168 hours. Lipid Profile: No results for input(s): CHOL, HDL, LDLCALC, TRIG, CHOLHDL, LDLDIRECT in the last 72 hours. Thyroid Function Tests: No results for input(s): TSH, T4TOTAL, FREET4, T3FREE, THYROIDAB in the last 72 hours. Anemia Panel: No results for input(s): VITAMINB12, FOLATE, FERRITIN, TIBC, IRON, RETICCTPCT in the last 72 hours. Urine analysis:    Component Value Date/Time   COLORURINE YELLOW 07/12/2011 0433   APPEARANCEUR CLEAR 07/12/2011 0433   LABSPEC 1.013 07/12/2011 0433   PHURINE 6.5 07/12/2011 0433   GLUCOSEU NEGATIVE 07/12/2011 0433   HGBUR SMALL (A) 07/12/2011 0433   BILIRUBINUR NEGATIVE 07/12/2011 0433   KETONESUR NEGATIVE 07/12/2011 0433   PROTEINUR NEGATIVE 07/12/2011 0433   UROBILINOGEN 0.2 07/12/2011 0433   NITRITE NEGATIVE 07/12/2011 0433   LEUKOCYTESUR NEGATIVE 07/12/2011 0433   Sepsis  Labs: @LABRCNTIP (procalcitonin:4,lacticidven:4) ) Recent Results (from the past 240 hour(s))  Surgical pcr screen     Status: None   Collection Time: 03/19/17 11:48 AM  Result Value Ref Range Status   MRSA, PCR NEGATIVE NEGATIVE Final   Staphylococcus aureus NEGATIVE NEGATIVE Final    Comment:        The Xpert SA Assay (FDA approved for NASAL specimens in patients over 19 years of age), is one component of a comprehensive surveillance program.  Test performance has been validated by [facility] for patients greater than or equal to 73 year old. It is not intended to diagnose infection nor to guide or monitor treatment.      Radiological Exams on Admission: No results found.   Assessment/Plan Principal Problem:   Compression fracture of body of thoracic vertebra 2/2 Fall -Status post intraoperative kyphoplasty -Management per orthopedic team  Active Problems:   Physical deconditioning/Dementia -Likely multifactorial: Secondary to pain and inability to mobilize from recent fall with compression fractures as well as concerns over progression and underlying dementia -Continue preadmission Aricept -PT/OT evaluation given concerns over need for SNF placement -SW request for SNF placement    Hypertension -Current blood pressure well controlled with systolic reading 161 -Continue Norvasc -Hold combination ARB/HCTZ for now given suboptimal blood pressure readings in PACU -Concerns over poor oral intake so may need to discontinue HCTZ permanently    Atrial fibrillation  -Newly diagnosed at cardiology office on 7/9. -Outpatient EP evaluation pending -Was not started on anticoagulation given upcoming surgical procedure and will need clearance from orthopedic surgeon before initiating-defer to cardiology -DO NOT GIVE any rate controlling agents such as beta blockers or calcium channel blockers per cardiologist- patient with documented history CHB/junctional escape rhythm and  patient may require pacemaker eventually -Aricept can cause bradycardia so consideration should be given to stopping if not a candidate for pacer    Underweight -Patient is 5 foot 8 and weighs 145 pounds -Suspect has had ongoing weight loss related to dementia -Nutrition consultation -Pre-albumin -Begin Ensure protein supplementation    HLD (hyperlipidemia) -Continue Lipitor       Jadasia Haws L. ANP-BC [facility] Pager [phone]   If 7PM-7AM, please contact night-coverage [email or web address] Password [facility]  03/24/2017, 10:44 AM

## 2017-03-24 NOTE — Anesthesia Procedure Notes (Signed)
Procedure Name: MAC Date/Time: 03/24/2017 9:19 AM Performed by: Lavell Luster Pre-anesthesia Checklist: Patient identified, Emergency Drugs available, Suction available, Patient being monitored and Timeout performed Patient Re-evaluated:Patient Re-evaluated prior to inductionOxygen Delivery Method: Nasal cannula Preoxygenation: Pre-oxygenation with 100% oxygen Intubation Type: IV induction Airway Equipment and Method: Patient positioned with wedge pillow Placement Confirmation: positive ETCO2 and breath sounds checked- equal and bilateral

## 2017-03-24 NOTE — Anesthesia Preprocedure Evaluation (Addendum)
Anesthesia Evaluation  Patient identified by MRN, date of birth, ID band Patient awake    Reviewed: Allergy & Precautions, NPO status , Patient's Chart, lab work & pertinent test results, reviewed documented beta blocker date and time   Airway Mallampati: III  TM Distance: >3 FB Neck ROM: Full    Dental no notable dental hx.    Pulmonary neg pulmonary ROS,    Pulmonary exam normal breath sounds clear to auscultation       Cardiovascular hypertension, Pt. on medications negative cardio ROS Normal cardiovascular exam Rhythm:Regular Rate:Normal     Neuro/Psych Depression negative neurological ROS  negative psych ROS   GI/Hepatic negative GI ROS, Neg liver ROS,   Endo/Other  negative endocrine ROS  Renal/GU negative Renal ROS  negative genitourinary   Musculoskeletal negative musculoskeletal ROS (+)   Abdominal   Peds negative pediatric ROS (+)  Hematology negative hematology ROS (+)   Anesthesia Other Findings EKG junctional with RBBB  Reproductive/Obstetrics negative OB ROS                            Anesthesia Physical Anesthesia Plan  ASA: II  Anesthesia Plan: General   Post-op Pain Management:    Induction: Intravenous  PONV Risk Score and Plan: 2 and Ondansetron, Dexamethasone and Treatment may vary due to age or medical condition  Airway Management Planned: Oral ETT  Additional Equipment:   Intra-op Plan:   Post-operative Plan: Extubation in OR  Informed Consent: I have reviewed the patients History and Physical, chart, labs and discussed the procedure including the risks, benefits and alternatives for the proposed anesthesia with the patient or authorized representative who has indicated his/her understanding and acceptance.   Dental advisory given  Plan Discussed with:   Anesthesia Plan Comments: (  )        Anesthesia Quick Evaluation

## 2017-03-24 NOTE — Progress Notes (Signed)
PT Cancellation Note  Patient Details Name: Joshua Bright MRN: [id number] DOB: Mar 12, 1927   Cancelled Treatment:    Reason Eval/Treat Not Completed: Patient at procedure or test/unavailable (orders for tomorrow)   Fabio Asaevon [name] 03/24/2017, 11:27 AM

## 2017-03-24 NOTE — Brief Op Note (Signed)
03/24/2017  10:00 AM  PATIENT:  Joshua Bright  83 y.o. male  PRE-OPERATIVE DIAGNOSIS:  L1 fracture  POST-OPERATIVE DIAGNOSIS:  L1 fracture  PROCEDURE:  Procedure(s): L1 Kyphoplasty  and T-11 Kyphoplasty (N/A)  SURGEON:  Surgeon(s) and Role:    Venita Lick* Hye Trawick, MD - Primary  PHYSICIAN ASSISTANT:   ASSISTANTS: none   ANESTHESIA:   IV sedation  EBL:  Total I/O In: 700 [I.V.:700] Out: -   BLOOD ADMINISTERED:none  DRAINS: none   LOCAL MEDICATIONS USED:  MARCAINE   & Exaperl  SPECIMEN:  No Specimen  DISPOSITION OF SPECIMEN:  N/A  COUNTS:  YES  TOURNIQUET:  * No tourniquets in log *  DICTATION: .Dragon Dictation  PLAN OF CARE: Admit to inpatient   PATIENT DISPOSITION:  PACU - hemodynamically stable.

## 2017-03-24 NOTE — Transfer of Care (Signed)
Immediate Anesthesia Transfer of Care Note  Patient: Joshua Bright  Procedure(s) Performed: Procedure(s): L1 Kyphoplasty  and T-11 Kyphoplasty (N/A)  Patient Location: PACU  Anesthesia Type:MAC  Level of Consciousness: awake, alert  and sedated  Airway & Oxygen Therapy: Patient connected to nasal cannula oxygen  Post-op Assessment: Post -op Vital signs reviewed and stable  Post vital signs: stable  Last Vitals:  Vitals:   03/24/17 0733  BP: (!) 183/79  Pulse: 84  Resp: 20  Temp: 37.2 C    Last Pain:  Vitals:   03/24/17 0733  TempSrc: Oral  PainSc: 4       Patients Stated Pain Goal: 2 (17/40/81 4481)  Complications: No apparent anesthesia complications

## 2017-03-24 NOTE — Progress Notes (Signed)
Patient arrived to floor, alert and oriented, Patient is hard of hearing(speak Louder and at face range). Incentive spirometer given and educated. Denies pain at this time, will continue to monitor.

## 2017-03-25 ENCOUNTER — Encounter (HOSPITAL_COMMUNITY): Payer: Self-pay | Admitting: *Deleted

## 2017-03-25 DIAGNOSIS — Z419 Encounter for procedure for purposes other than remedying health state, unspecified: Secondary | ICD-10-CM

## 2017-03-25 DIAGNOSIS — L8990 Pressure ulcer of unspecified site, unspecified stage: Secondary | ICD-10-CM | POA: Insufficient documentation

## 2017-03-25 DIAGNOSIS — M8088XA Other osteoporosis with current pathological fracture, vertebra(e), initial encounter for fracture: Secondary | ICD-10-CM | POA: Diagnosis not present

## 2017-03-25 NOTE — Clinical Social Work Note (Signed)
Clinical Social Work Assessment  Patient Details  Name: Joshua Bright C Akhavan MRN: [id number] Date of Birth: 1926-10-14  Date of referral:  03/25/17               Reason for consult:  Facility Placement, Discharge Planning                Permission sought to share information with:  Facility Medical sales representativeContact Representative, Family Supports Permission granted to share information::  Yes, Verbal Permission Granted  Name::     Dispensing opticianGretchen  Agency::  SNF  Relationship::  Wife  Contact Information:     Housing/Transportation Living arrangements for the past 2 months:  Single Family Home Source of Information:  Patient, Spouse Patient Interpreter Needed:  None Criminal Activity/Legal Involvement Pertinent to Current Situation/Hospitalization:  No - Comment as needed Significant Relationships:  Adult Children, Spouse Lives with:  Self, Spouse Do you feel safe going back to the place where you live?  Yes Need for family participation in patient care:  No (Coment)  Care giving concerns:  Patient has been living at home with support from wife, but wife cannot manage the patient at the home without first obtaining rehab to improve mobility.   Social Worker assessment / plan:  CSW introduced self to patient, patient's wife, and patient's son at bedside. CSW explained recommendation for SNF placement and discussed options with the patient's wife and patient's son. Patient's wife indicated preference [place] either [facility] [place] or [address] in [place]; patient's wife and patient's son indicated wanting to stay close to home for rehab. CSW faxed out referral and will follow to facilitate discharge.  Employment status:  Retired Database administratornsurance information:  Managed Medicare PT Recommendations:  Skilled Nursing Facility Information / Referral to community resources:  Skilled Nursing Facility  Patient/Family's Response to care:  Patient and patient's family agreeable to SNF placement.  Patient/Family's  Understanding of and Emotional Response to Diagnosis, Current Treatment, and Prognosis:  Patient and patient's family indicated understanding of the need for short term rehab stay, and appreciated CSW assistance in coordinating discharge.  Emotional Assessment Appearance:  Appears stated age Attitude/Demeanor/Rapport:    Affect (typically observed):  Appropriate Orientation:  Oriented to Situation, Oriented to  Time, Oriented to Place, Oriented to Self Alcohol / Substance use:  Not Applicable Psych involvement (Current and /or in the community):  No (Comment)  Discharge Needs  Concerns to be addressed:  Care Coordination, Discharge Planning Concerns Readmission within the last 30 days:  No Current discharge risk:  Physical Impairment Barriers to Discharge:  Continued Medical Work up   Dollar GeneralElizabeth M Christinea Brizuela, LCSW 03/25/2017, 3:47 PM

## 2017-03-25 NOTE — Consult Note (Signed)
WOC Nurse wound consult note Reason for Consult: Consult requested for buttocks.  Wound type: 2 areas of stage 2 pressure injuries to bilat upper buttocks Pressure Injury POA: Yes; these were noted as present on admission in the nursing flow sheet Measurement: Each site is .5X.5X.1cm, red moist, surrounded by approx 2 cm dark reddish purple skin.  There is shaggy loose skin to wound edges; appearance is consistent with pressure and shear.  Dressing procedure/placement/frequency: Foam dressing to protect and promote healing and reduce shear. Discussed plan of care with patient and he verbalized understanding. Please re-consult if further assistance is needed.  Thank-you,  Cammie Mcgeeawn Veronda Gabor MSN, RN, CWOCN, DanielsonWCN-AP, CNS [phone]

## 2017-03-25 NOTE — Progress Notes (Addendum)
Patient has no significant internal medicine needs at this juncture. Cardiology Corky CraftsVaranasi, Jayadeep S, MD has already seen patient on 7/9. Please refer to their progress note for recommendations. Patient is supposed to follow-up with Dr. Ladona Ridgelaylor in the outpatient setting. Please contact Dr.Varanasi, or cardiology on-call to discuss anticoagulation post back surgery   PT/OT consulted in anticipation of discharge to SNF bed. SW asked to begin bed search. TRH  Signed  off  03/24/17

## 2017-03-25 NOTE — NC FL2 (Signed)
[place] MEDICAID FL2 LEVEL OF CARE SCREENING TOOL     IDENTIFICATION  Patient Name: Joshua Bright Birthdate: Jan 28, 1927 Sex: male Admission Date (Current Location): 03/24/2017  [facility] [place]   Facility and Address:  [facility], [address], [place], [place] [address]      Provider Number: [id number]  Attending Physician Name and Address:  Venita LickBrooks, Dahari, MD  Relative Name and Phone Number:       Current Level of Care: Hospital Recommended Level of Care: Skilled Nursing Facility Prior Approval Number:    Date Approved/Denied:   PASRR Number: [id number]  Discharge Plan: SNF    Current Diagnoses: Patient Active Problem List   Diagnosis Date Noted  . Pressure injury of skin 03/25/2017  . Compression fracture of body of thoracic vertebra (HCC) 03/24/2017  . Hypertension 03/24/2017  . Dementia 03/24/2017  . Falls 03/24/2017  . Physical deconditioning 03/24/2017  . Atrial fibrillation (HCC) 03/24/2017  . HLD (hyperlipidemia) 03/24/2017  . Underweight 03/24/2017  . Essential hypertension 03/22/2017    Orientation RESPIRATION BLADDER Height & Weight     Self, Time, Situation, Place  O2 (Dora 2L) Incontinent Weight: 155 lb 1.6 oz (70.4 kg) Height:  5\' 8"  (172.7 cm)  BEHAVIORAL SYMPTOMS/MOOD NEUROLOGICAL BOWEL NUTRITION STATUS       (unknown)    AMBULATORY STATUS COMMUNICATION OF NEEDS Skin   Extensive Assist Verbally PU Stage and Appropriate Care, Surgical wounds   PU Stage 2 Dressing:  (unknown)                   Personal Care Assistance Level of Assistance  Bathing, Dressing Bathing Assistance: Maximum assistance   Dressing Assistance: Maximum assistance     Functional Limitations Info  Hearing   Hearing Info: Impaired      SPECIAL CARE FACTORS FREQUENCY  PT (By licensed PT), OT (By licensed OT)     PT Frequency: 5x/wk OT Frequency: 5x/wk            Contractures      Additional  Factors Info  Allergies, Code Status Code Status Info: full Allergies Info: nka           Current Medications (03/25/2017):  This is the current hospital active medication list Current Facility-Administered Medications  Medication Dose Route Frequency Provider Last Rate Last Dose  . 0.9 %  sodium chloride infusion  250 mL Intravenous Continuous Venita LickBrooks, Dahari, MD      . acetaminophen (TYLENOL) tablet 650 mg  650 mg Oral Q4H PRN Venita LickBrooks, Dahari, MD   650 mg at 03/25/17 0831   Or  . acetaminophen (TYLENOL) suppository 650 mg  650 mg Rectal Q4H PRN Venita LickBrooks, Dahari, MD      . amLODipine (NORVASC) tablet 5 mg  5 mg Oral Daily Venita LickBrooks, Dahari, MD   5 mg at 03/25/17 0831  . atorvastatin (LIPITOR) tablet 20 mg  20 mg Oral Q lunch Venita LickBrooks, Dahari, MD   20 mg at 03/25/17 1209  . donepezil (ARICEPT) tablet 10 mg  10 mg Oral Daily Venita LickBrooks, Dahari, MD   10 mg at 03/25/17 0831  . feeding supplement (ENSURE ENLIVE) (ENSURE ENLIVE) liquid 237 mL  237 mL Oral BID BM Russella DarEllis, Allison L, NP   237 mL at 03/25/17 1000  . finasteride (PROSCAR) tablet 5 mg  5 mg Oral Daily Venita LickBrooks, Dahari, MD   5 mg at 03/25/17 0831  . lactated ringers infusion   Intravenous Continuous Shon BatonBrooks,  Dahari, MD 85 mL/hr at 03/24/17 1737    . menthol-cetylpyridinium (CEPACOL) lozenge 3 mg  1 lozenge Oral PRN Venita Lick, MD       Or  . phenol (CHLORASEPTIC) mouth spray 1 spray  1 spray Mouth/Throat PRN Venita Lick, MD      . methocarbamol (ROBAXIN) tablet 500 mg  500 mg Oral Q6H PRN Venita Lick, MD   500 mg at 03/25/17 0831   Or  . methocarbamol (ROBAXIN) 500 mg in dextrose 5 % 50 mL IVPB  500 mg Intravenous Q6H PRN Venita Lick, MD      . ondansetron [facility]) tablet 4 mg  4 mg Oral Q6H PRN Venita Lick, MD       Or  . ondansetron [facility]) injection 4 mg  4 mg Intravenous Q6H PRN Venita Lick, MD      . polyvinyl alcohol (LIQUIFILM TEARS) 1.4 % ophthalmic solution   Both Eyes TID PRN Venita Lick, MD      . sodium chloride  flush (NS) 0.9 % injection 3 mL  3 mL Intravenous Q12H Venita Lick, MD      . sodium chloride flush (NS) 0.9 % injection 3 mL  3 mL Intravenous PRN Venita Lick, MD         Discharge Medications: Please see discharge summary for a list of discharge medications.  Relevant Imaging Results:  Relevant Lab Results:   Additional Information SS#: [id number]  Baldemar Lenis, LCSW

## 2017-03-25 NOTE — Evaluation (Signed)
Physical Therapy Evaluation Patient Details Name: Joshua Bright MRN: [id number] DOB: [birth year] Today's Date: 03/25/2017   History of Present Illness  Pt is an 83 y/o male s/p T11 and L1 kyphoplasty. PMH including but not limited to dementia, HLD, HTN and spinal stenosis.  Clinical Impression  Pt presented sitting EOB with staff, awake and willing to participate in therapy session. Prior to admission, pt reported that he used a RW to ambulate and wife assisted him with ADLs. Pt currently requires two person physical assistance for bed mobility and transfers. Pt limited secondary to pain and weakness. Pt would continue to benefit from skilled physical therapy services at this time while admitted and after d/c to address the below listed limitations in order to improve overall safety and independence with functional mobility.      Follow Up Recommendations SNF;Supervision/Assistance - 24 hour    Equipment Recommendations  None recommended by PT;Other (comment) (defer to next venue)    Recommendations for Other Services       Precautions / Restrictions Precautions Precautions: Fall;Back Precaution Booklet Issued: Yes (comment) Required Braces or Orthoses:  ("no brace needed") Restrictions Weight Bearing Restrictions: No      Mobility  Bed Mobility               General bed mobility comments: pt sitting EOB with RN and nurse tech upon arrival, who reported that he was a total A for bed mobility  Transfers Overall transfer level: Needs assistance Equipment used: 2 person hand held assist Transfers: Sit to/from UGI CorporationStand;Stand Pivot Transfers Sit to Stand: Max assist;+2 physical assistance Stand pivot transfers: Total assist;+2 physical assistance       General transfer comment: increased time, multimodal cueing for technique, max A x2 to rise from bed, total A x2 for pivotal movement to chair  Ambulation/Gait                Stairs            Wheelchair  Mobility    Modified Rankin (Stroke Patients Only)       Balance Overall balance assessment: Needs assistance Sitting-balance support: Feet supported;Bilateral upper extremity supported Sitting balance-Leahy Scale: Poor Sitting balance - Comments: pt required mod A to maintain sitting EOB Postural control: Left lateral lean Standing balance support: During functional activity;Bilateral upper extremity supported Standing balance-Leahy Scale: Poor Standing balance comment: max A x2                             Pertinent Vitals/Pain Pain Assessment: Faces Faces Pain Scale: Hurts whole lot Pain Location: back Pain Descriptors / Indicators: Sore;Grimacing;Guarding;Moaning Pain Intervention(s): Monitored during session;Repositioned    Home Living Family/patient expects to be discharged to:: Private residence Living Arrangements: Spouse/significant other Available Help at Discharge: Family;Available PRN/intermittently Type of Home: House Home Access: Ramped entrance     Home Layout: Able to live on main level with bedroom/bathroom Home Equipment: Walker - 2 wheels;Cane - single point;Wheelchair - Careers advisermanual;Other (comment);Bedside commode;Shower seat - built in Arts administrator(lift chair)      Prior Function Level of Independence: Needs assistance   Gait / Transfers Assistance Needed: pt ambulates with use of RW  ADL's / Homemaking Assistance Needed: pt's wife assists him with bathing        Hand Dominance   Dominant Hand: Right    Extremity/Trunk Assessment   Upper Extremity Assessment Upper Extremity Assessment: Defer to OT evaluation    Lower Extremity  Assessment Lower Extremity Assessment: Generalized weakness    Cervical / Trunk Assessment Cervical / Trunk Assessment: Kyphotic;Other exceptions Cervical / Trunk Exceptions: s/p thoracic and lumbar sx  Communication   Communication: HOH  Cognition Arousal/Alertness: Awake/alert Behavior During Therapy:  Anxious Overall Cognitive Status: Impaired/Different from baseline Area of Impairment: Memory;Following commands;Safety/judgement;Problem solving                     Memory: Decreased recall of precautions;Decreased short-term memory Following Commands: Follows one step commands inconsistently;Follows one step commands with increased time Safety/Judgement: Decreased awareness of safety;Decreased awareness of deficits   Problem Solving: Slow processing;Decreased initiation;Difficulty sequencing;Requires verbal cues;Requires tactile cues General Comments: no family/caregivers present, pt required multimodal cueing for functional mobility tasks      General Comments      Exercises     Assessment/Plan    PT Assessment Patient needs continued PT services  PT Problem List Decreased strength;Decreased activity tolerance;Decreased balance;Decreased mobility;Decreased coordination;Decreased knowledge of use of DME;Decreased cognition;Decreased safety awareness;Decreased knowledge of precautions;Pain       PT Treatment Interventions DME instruction;Gait training;Stair training;Functional mobility training;Therapeutic activities;Therapeutic exercise;Balance training;Neuromuscular re-education;Patient/family education;Cognitive remediation    PT Goals (Current goals can be found in the Care Plan section)  Acute Rehab PT Goals Patient Stated Goal: decreased pain PT Goal Formulation: With patient Time For Goal Achievement: 04/08/17 Potential to Achieve Goals: Fair    Frequency Min 3X/week   Barriers to discharge        Co-evaluation PT/OT/SLP Co-Evaluation/Treatment: Yes Reason for Co-Treatment: To address functional/ADL transfers;For patient/therapist safety PT goals addressed during session: Mobility/safety with mobility;Balance;Proper use of DME;Strengthening/ROM         AM-PAC PT "6 Clicks" Daily Activity  Outcome Measure Difficulty turning over in bed (including  adjusting bedclothes, sheets and blankets)?: Total Difficulty moving from lying on back to sitting on the side of the bed? : Total Difficulty sitting down on and standing up from a chair with arms (e.g., wheelchair, bedside commode, etc,.)?: Total Help needed moving to and from a bed to chair (including a wheelchair)?: A Lot Help needed walking in hospital room?: Total Help needed climbing 3-5 steps with a railing? : Total 6 Click Score: 7    End of Session Equipment Utilized During Treatment: Gait belt Activity Tolerance: Patient limited by pain;Patient limited by fatigue Patient left: in chair;with call bell/phone within reach;with chair alarm set Nurse Communication: Mobility status;Need for lift equipment;Precautions PT Visit Diagnosis: Other abnormalities of gait and mobility (R26.89);Other (comment)    Time: 1610-9604 PT Time Calculation (min) (ACUTE ONLY): 34 min   Charges:   PT Evaluation $PT Eval Moderate Complexity: 1 Procedure     PT G Codes:        Deborah Chalk, PT, DPT [phone]   Alessandra Bevels Tynlee Bayle 03/25/2017, 9:11 AM

## 2017-03-25 NOTE — Care Management Obs Status (Signed)
MEDICARE OBSERVATION STATUS NOTIFICATION   Patient Details  Name: Joshua Bright MRN: [id number] Date of Birth: 1926/11/23   Medicare Observation Status Notification Given:  Yes    Kermit BaloKelli F Florabelle Cardin, RN 03/25/2017, 2:50 PM

## 2017-03-25 NOTE — Progress Notes (Signed)
    Subjective: 1 Day Post-Op Procedure(s) (LRB): L1 Kyphoplasty  and T-11 Kyphoplasty (N/A) Patient reports pain as 2 on 0-10 scale.   Denies CP or SOB.  Foley still in place. Positive flatus. Objective: Vital signs in last 24 hours: Temp:  [97.3 F (36.3 C)-98.2 F (36.8 C)] 97.9 F (36.6 C) (07/12 1024) Pulse Rate:  [49-62] 62 (07/12 1024) Resp:  [11-18] 18 (07/12 1024) BP: (119-173)/(51-84) 137/81 (07/12 1024) SpO2:  [94 %-100 %] 97 % (07/12 1024) Weight:  [68.3 kg (150 lb 9.6 oz)-70.4 kg (155 lb 1.6 oz)] 70.4 kg (155 lb 1.6 oz) (07/12 0500)  Intake/Output from previous day: 07/11 0701 - 07/12 0700 In: 1582.6 [I.V.:1582.6] Out: 50 [Blood:50] Intake/Output this shift: Total I/O In: 720 [P.O.:720] Out: -   Labs: No results for input(s): HGB in the last 72 hours. No results for input(s): WBC, RBC, HCT, PLT in the last 72 hours. No results for input(s): NA, K, CL, CO2, BUN, CREATININE, GLUCOSE, CALCIUM in the last 72 hours. No results for input(s): LABPT, INR in the last 72 hours.  Physical Exam: Neurologically intact ABD soft Sensation intact distally Dorsiflexion/Plantar flexion intact Incision: no drainage Compartment soft  Assessment/Plan: 1 Day Post-Op Procedure(s) (LRB): L1 Kyphoplasty  and T-11 Kyphoplasty (N/A) Advance diet Up with therapy  SW working on placement for Rehab in a SNF  [name], [name] for Dr. Venita Lickahari Brooks [facility] [phone] 03/25/2017, 12:36 PM    Patient ID: Joshua Bright, male   DOB: 01-29-1927, 83 y.o.   MRN: [id number]

## 2017-03-25 NOTE — Progress Notes (Signed)
Initial Nutrition Assessment  DOCUMENTATION CODES:   Non-severe (moderate) malnutrition in context of chronic illness  INTERVENTION:   Ensure Enlive po BID, each supplement provides 350 kcal and 20 grams of protein   NUTRITION DIAGNOSIS:   Malnutrition (Moderate) related to  (dementia, spinal stenosis, FTT) as evidenced by moderate depletion of body fat, moderate depletions of muscle mass.  GOAL:   Patient will meet greater than or equal to 90% of their needs  MONITOR:   PO intake, Supplement acceptance, Labs, Weight trends, Skin  REASON FOR ASSESSMENT:   Consult Assessment of nutrition requirement/status  ASSESSMENT:   83 yo male admitted with compression fracture of body of thoracic vertebra post fall. Pt with physical deconditioning due to immobilization post fall as well as concerns over progression of underlying dementia.  Pt with hx of dementia, HTN, dyslipidemia, spinal stenosis.   7/11 L1 and T11 kyphoplasty  No weight trend available. Pt reports UBW around 145 pounds  Pt reports he ate most of the eggs and grits and drank some coffee at breakfast this AM. Recorded po intake 50% at breakfast. Pt reports appetite has been good at home but unable to tell writer what he eats on a daily basis. Pt reports he does not take nutritional supplements such as Ensure or Boost.   Noted pt has been restricted to wheel chair for the past 2-3 weeks after falling.   Nutrition-Focused physical exam completed. Findings are mild to moderate fat depletion, mild to moderate muscle depletion, and no edema.   Labs: reviewed Meds: reviewed   Diet Order:  Diet regular Room service appropriate? Yes; Fluid consistency: Thin  Skin:  Wound (see comment) (stage II sacrum)  Last BM:  no documented BM  Height:   Ht Readings from Last 1 Encounters:  03/24/17 5\' 8"  (1.727 m)    Weight:   Wt Readings from Last 1 Encounters:  03/25/17 155 lb 1.6 oz (70.4 kg)   BMI:  Body mass index  is 23.58 kg/m.  Estimated Nutritional Needs:   Kcal:  1600-1800 kcals  Protein:  84-98 g  Fluid:  >/= 1.7 L  EDUCATION NEEDS:   No education needs identified at this time  Romelle StarcherCate Jorel Gravlin MS, RD, LDN [phone] Pager  [phone] Weekend/On-Call Pager

## 2017-03-25 NOTE — Progress Notes (Signed)
Patient with minimal complaints of pain/managed with meds, changed positions through night. Unable to ambulate at this time. Dangled at edge of bed for several minutes and stood at edge of bed for approximately 30 seconds with max 2 person assist.  Encourage use of incentive spirometer. Continue to monitor patient.

## 2017-03-25 NOTE — Care Management CC44 (Signed)
Condition Code 44 Documentation Completed  Patient Details  Name: Joshua Bright MRN: [id number] Date of Birth: 01/24/1927   Condition Code 44 given:  Yes Patient signature on Condition Code 44 notice:  Yes Documentation of 2 MD's agreement:  Yes Code 44 added to claim:  Yes    Kermit BaloKelli F Fynn Adel, RN 03/25/2017, 2:50 PM

## 2017-03-25 NOTE — Evaluation (Addendum)
Occupational Therapy Evaluation Patient Details Name: Joshua Bright C Davisson MRN: [id number] DOB: 08-08-1927 Today's Date: 03/25/2017    History of Present Illness Pt is an 83 y/o male s/p T11 and L1 kyphoplasty. PMH including but not limited to dementia, HLD, HTN and spinal stenosis.   Clinical Impression   PTA Pt got min A from wife for bathing and dressing, and used a RW for mobility but still operating tractor and community mobilizer. Pt is currently max A for ADL and max A +2 for stand pivot transfers (RN staff advised to use lift) Pt requires max cues for participation in any therapy. Back handout provided and reviewed adls in detail. Pt educated on: avoid sitting for long periods of time, correct bed positioning for sleeping, correct sequence for bed mobility, avoiding lifting more than 5 pounds and never wash directly over incision. Pt will benefit from skilled OT in the acute setting prior to SNF placement to maximize safety and independence in ADL and to return to PLOF. Next session to focus on potential intro to AE, and continue functional transfer education in prep for ADL.     Follow Up Recommendations  SNF;Supervision/Assistance - 24 hour    Equipment Recommendations  Other (comment) (defer to next venue)    Recommendations for Other Services       Precautions / Restrictions Precautions Precautions: Fall;Back Precaution Booklet Issued: Yes (comment) Required Braces or Orthoses:  ("no brace needed") Restrictions Weight Bearing Restrictions: No      Mobility Bed Mobility               General bed mobility comments: pt sitting EOB with RN and nurse tech upon arrival, who reported that he was a total A for bed mobility  Transfers Overall transfer level: Needs assistance Equipment used: 2 person hand held assist Transfers: Sit to/from UGI CorporationStand;Stand Pivot Transfers Sit to Stand: Max assist;+2 physical assistance Stand pivot transfers: Total assist;+2 physical  assistance       General transfer comment: increased time, multimodal cueing for technique, max A x2 to rise from bed, total A x2 for pivotal movement to chair    Balance Overall balance assessment: Needs assistance Sitting-balance support: Feet supported;Bilateral upper extremity supported Sitting balance-Leahy Scale: Poor Sitting balance - Comments: pt required mod A to maintain sitting EOB Postural control: Left lateral lean Standing balance support: During functional activity;Bilateral upper extremity supported Standing balance-Leahy Scale: Poor Standing balance comment: max A x2                           ADL either performed or assessed with clinical judgement   ADL Overall ADL's : Needs assistance/impaired Eating/Feeding: Sitting;Set up Eating/Feeding Details (indicate cue type and reason): assist to open containers, required encouragement to feed self Grooming: Sitting;Minimal assistance   Upper Body Bathing: Moderate assistance;Sitting   Lower Body Bathing: Total assistance   Upper Body Dressing : Moderate assistance   Lower Body Dressing: Total assistance;Bed level   Toilet Transfer: +2 for physical assistance;Stand-pivot;BSC;Maximal assistance Toilet Transfer Details (indicate cue type and reason): simulated with recliner, max vc and very little participation on Pt' end Toileting- ArchitectClothing Manipulation and Hygiene: Total assistance Toileting - Clothing Manipulation Details (indicate cue type and reason): Pt in condom catheter     Functional mobility during ADLs: Maximal assistance General ADL Comments: Pt had to be encouraged to participate in ADL - even when using arms     Vision Patient Visual Report: No change  from baseline       Perception     Praxis      Pertinent Vitals/Pain Pain Assessment: Faces Faces Pain Scale: Hurts whole lot Pain Location: back Pain Descriptors / Indicators: Sore;Grimacing;Guarding;Moaning Pain Intervention(s):  Monitored during session;Repositioned     Hand Dominance Right   Extremity/Trunk Assessment Upper Extremity Assessment Upper Extremity Assessment: Generalized weakness (Pt states he has baseline tingling and neuropathy)   Lower Extremity Assessment Lower Extremity Assessment: Defer to PT evaluation   Cervical / Trunk Assessment Cervical / Trunk Assessment: Kyphotic;Other exceptions Cervical / Trunk Exceptions: s/p thoracic and lumbar sx   Communication Communication Communication: HOH   Cognition Arousal/Alertness: Awake/alert Behavior During Therapy: Anxious Overall Cognitive Status: Impaired/Different from baseline Area of Impairment: Memory;Following commands;Safety/judgement;Problem solving                     Memory: Decreased recall of precautions;Decreased short-term memory Following Commands: Follows one step commands inconsistently;Follows one step commands with increased time Safety/Judgement: Decreased awareness of safety;Decreased awareness of deficits   Problem Solving: Slow processing;Decreased initiation;Difficulty sequencing;Requires verbal cues;Requires tactile cues General Comments: no family/caregivers present, pt required multimodal cueing for functional mobility tasks   General Comments       Exercises     Shoulder Instructions      Home Living Family/patient expects to be discharged to:: Private residence Living Arrangements: Spouse/significant other Available Help at Discharge: Family;Available PRN/intermittently Type of Home: House Home Access: Ramped entrance     Home Layout: Able to live on main level with bedroom/bathroom     Bathroom Shower/Tub: Tub/shower unit;Other (comment) (walk-in tub)   Bathroom Toilet: Standard     Home Equipment: Walker - 2 wheels;Cane - single point;Wheelchair - Careers adviser (comment);Bedside commode;Shower seat - built in Arts administrator)          Prior Functioning/Environment Level of  Independence: Needs assistance  Gait / Transfers Assistance Needed: pt ambulates with use of RW ADL's / Homemaking Assistance Needed: pt's wife assists him with bathing, and sometimes feeding when in recliner            OT Problem List: Decreased strength;Decreased range of motion;Decreased activity tolerance;Impaired balance (sitting and/or standing);Decreased safety awareness;Decreased knowledge of use of DME or AE;Decreased knowledge of precautions;Pain      OT Treatment/Interventions: Self-care/ADL training;Energy conservation;DME and/or AE instruction;Patient/family education;Balance training;Therapeutic activities    OT Goals(Current goals can be found in the care plan section) Acute Rehab OT Goals Patient Stated Goal: decreased pain OT Goal Formulation: With patient Time For Goal Achievement: 03/25/17 Potential to Achieve Goals: Good ADL Goals Pt Will Perform Grooming: with set-up;sitting Pt Will Perform Upper Body Bathing: with set-up;sitting Pt Will Perform Lower Body Bathing: with min assist;with adaptive equipment;with caregiver independent in assisting;sitting/lateral leans Pt Will Transfer to Toilet: with mod assist;stand pivot transfer;bedside commode (with RW) Pt Will Perform Toileting - Clothing Manipulation and hygiene: sit to/from stand;with mod assist Additional ADL Goal #1: Pt will recall 3/3 back precautions and maintain at supervision level during ADL activity Additional ADL Goal #2: Pt will maintain back precautions during bed mobility at min A level as precursor to ADL  OT Frequency: Min 2X/week   Barriers to D/C:            Co-evaluation PT/OT/SLP Co-Evaluation/Treatment: Yes Reason for Co-Treatment: To address functional/ADL transfers;For patient/therapist safety PT goals addressed during session: Mobility/safety with mobility;Balance;Proper use of DME;Strengthening/ROM OT goals addressed during session: ADL's and self-care      AM-PAC  PT "6 Clicks"  Daily Activity     Outcome Measure Help from another person eating meals?: A Little Help from another person taking care of personal grooming?: A Little Help from another person toileting, which includes using toliet, bedpan, or urinal?: A Lot Help from another person bathing (including washing, rinsing, drying)?: A Lot Help from another person to put on and taking off regular upper body clothing?: A Lot Help from another person to put on and taking off regular lower body clothing?: A Lot 6 Click Score: 14   End of Session Equipment Utilized During Treatment: Gait belt Nurse Communication: Mobility status;Precautions  Activity Tolerance: Patient limited by pain Patient left: in chair;with call bell/phone within reach;with chair alarm set  OT Visit Diagnosis: Unsteadiness on feet (R26.81);Muscle weakness (generalized) (M62.81);Pain Pain - Right/Left: Right Pain - part of body: Leg (back)                Time: 1610-9604 OT Time Calculation (min): 23 min Charges:  OT General Charges $OT Visit: 1 Procedure OT Evaluation $OT Eval Moderate Complexity: 1 Procedure G-Codes:     Sherryl Manges OTR/L [phone]  Evern Bio Achsah Mcquade 04/23/2017, 11:21 AM   Addendum: Add G Codes   Apr 23, 2017 1000  OT G-codes **NOT FOR INPATIENT CLASS**  Functional Assessment Tool Used AM-PAC 6 Clicks Daily Activity  Functional Limitation Self care  Self Care Current Status (V4098) CK  Self Care Goal Status (J1914) CI

## 2017-03-26 DIAGNOSIS — M8088XA Other osteoporosis with current pathological fracture, vertebra(e), initial encounter for fracture: Secondary | ICD-10-CM | POA: Diagnosis not present

## 2017-03-26 MED ORDER — ONDANSETRON HCL 4 MG PO TABS: 4.0000 mg | ORAL_TABLET | Freq: Every day | ORAL | 0 refills | Status: AC | PRN

## 2017-03-26 MED ORDER — HYDROCODONE-ACETAMINOPHEN 10-325 MG PO TABS: 1.0000 | ORAL_TABLET | ORAL | 0 refills | Status: AC | PRN

## 2017-03-26 MED ORDER — POLYETHYLENE GLYCOL 3350 17 G PO PACK
17.0000 g | PACK | Freq: Every day | ORAL | Status: DC | PRN
  Administered 2017-03-26: 17 g via ORAL
  Filled 2017-03-26 (×2): qty 1

## 2017-03-26 MED ORDER — DOCUSATE SODIUM 100 MG PO CAPS
100.0000 mg | ORAL_CAPSULE | Freq: Two times a day (BID) | ORAL | Status: DC
  Administered 2017-03-26: 100 mg via ORAL
  Filled 2017-03-26: qty 1

## 2017-03-26 MED ORDER — METHOCARBAMOL 500 MG PO TABS: 500.0000 mg | ORAL_TABLET | Freq: Three times a day (TID) | ORAL | 0 refills | Status: AC

## 2017-03-26 MED ORDER — HYDROCODONE-ACETAMINOPHEN 10-325 MG PO TABS
1.0000 | ORAL_TABLET | ORAL | Status: DC | PRN
  Administered 2017-03-26: 1 via ORAL
  Filled 2017-03-26: qty 1

## 2017-03-26 MED ORDER — SENNA 8.6 MG PO TABS
1.0000 | ORAL_TABLET | Freq: Two times a day (BID) | ORAL | Status: DC
  Administered 2017-03-26: 8.6 mg via ORAL
  Filled 2017-03-26: qty 1

## 2017-03-26 NOTE — Progress Notes (Signed)
Occupational Therapy Treatment Patient Details Name: Joshua Bright MRN: [id number] DOB: 05/30/1927 Today's Date: 03/26/2017    History of present illness Pt is an 83 y/o male s/p T11 and L1 kyphoplasty. PMH including but not limited to dementia, HLD, HTN and spinal stenosis.   OT comments  Pt presented in recliner, in pain and wanting to return to bed. OT and NT returned Pt to bed squat pivot (Pt unable to come to full standing even with max assist). Pt requires to require OT in the acute setting with follow up at SNF level.   Follow Up Recommendations  SNF;Supervision/Assistance - 24 hour    Equipment Recommendations  Other (comment) (defer to next venue)    Recommendations for Other Services      Precautions / Restrictions Precautions Precautions: Fall;Back Precaution Booklet Issued: Yes (comment) Precaution Comments: pt requires constant reminders for postural correction and to maintain back precautions Required Braces or Orthoses:  ("no brace needed") Restrictions Weight Bearing Restrictions: No       Mobility Bed Mobility               General bed mobility comments: pt sitting OOB in recliner chair upon arrival  Transfers Overall transfer level: Needs assistance Equipment used: 2 person hand held assist Antony Salmon) Transfers: Squat Pivot Transfers Sit to Stand: Total assist;+2 physical assistance;+2 safety/equipment   Squat pivot transfers: Total assist;+2 physical assistance;+2 safety/equipment     General transfer comment: Heavy use of bedpad to facilitate upward momentum    Balance Overall balance assessment: Needs assistance Sitting-balance support: Feet supported;Bilateral upper extremity supported Sitting balance-Leahy Scale: Poor     Standing balance support: During functional activity;Bilateral upper extremity supported Standing balance-Leahy Scale: Zero Standing balance comment: could not fully achieve erect standing position                            ADL either performed or assessed with clinical judgement   ADL Overall ADL's : Needs assistance/impaired                         Toilet Transfer: +2 for physical assistance;Stand-pivot;BSC;Maximal assistance Toilet Transfer Details (indicate cue type and reason): simulated with recliner, max vc and very little participation on Pt' end         Functional mobility during ADLs:  (unsafe for attempt at this time)       Vision       Perception     Praxis      Cognition Arousal/Alertness: Awake/alert Behavior During Therapy: Anxious Overall Cognitive Status: Impaired/Different from baseline Area of Impairment: Memory;Following commands;Safety/judgement;Problem solving                     Memory: Decreased recall of precautions;Decreased short-term memory Following Commands: Follows one step commands inconsistently;Follows one step commands with increased time Safety/Judgement: Decreased awareness of safety;Decreased awareness of deficits   Problem Solving: Slow processing;Decreased initiation;Difficulty sequencing;Requires verbal cues;Requires tactile cues General Comments: no family/caregivers present, pt required multimodal cueing for functional mobility tasks        Exercises     Shoulder Instructions       General Comments Pt's wife in room during session    Pertinent Vitals/ Pain       Pain Assessment: Faces Faces Pain Scale: Hurts whole lot Pain Location: back Pain Descriptors / Indicators: Sore;Grimacing;Guarding;Moaning Pain Intervention(s): Repositioned;Monitored during session  Home Living  Prior Functioning/Environment              Frequency  Min 2X/week        Progress Toward Goals  OT Goals(current goals can now be found in the care plan section)  Progress towards OT goals: Not progressing toward goals - comment (Pt very limited by  pain)  Acute Rehab OT Goals Patient Stated Goal: decreased pain OT Goal Formulation: With patient Time For Goal Achievement: 04/01/17 Potential to Achieve Goals: Good  Plan Discharge plan remains appropriate    Co-evaluation                 AM-PAC PT "6 Clicks" Daily Activity     Outcome Measure   Help from another person eating meals?: A Little Help from another person taking care of personal grooming?: A Lot Help from another person toileting, which includes using toliet, bedpan, or urinal?: Total Help from another person bathing (including washing, rinsing, drying)?: A Lot Help from another person to put on and taking off regular upper body clothing?: Total Help from another person to put on and taking off regular lower body clothing?: Total 6 Click Score: 10    End of Session Equipment Utilized During Treatment: Gait belt  OT Visit Diagnosis: Unsteadiness on feet (R26.81);Muscle weakness (generalized) (M62.81);Pain Pain - Right/Left: Right Pain - part of body: Leg (back)   Activity Tolerance Patient limited by pain   Patient Left in bed;with call bell/phone within reach;with bed alarm set;with family/visitor present;with nursing/sitter in room   Nurse Communication Mobility status;Precautions;Need for lift equipment        Time: 1421-1440 OT Time Calculation (min): 19 min  Charges: OT General Charges $OT Visit: 1 Procedure OT Treatments $Self Care/Home Management : 8-22 mins  Sherryl MangesLaura Nazarene Bunning OTR/L [phone]  Joshua Bright 03/26/2017, 4:09 PM

## 2017-03-26 NOTE — Progress Notes (Signed)
Physician Discharge Summary  Patient ID: Joshua Bright MRN: [id number] DOB/AGE: [birth year] 83 y.o.  Admit date: 03/24/2017 Discharge date: 03/26/2017  Admission Diagnoses:  Compression fracture of body of thoracic vertebra [facility](HCC)  Discharge Diagnoses:  Principal Problem:   Compression fracture of body of thoracic vertebra (HCC) Active Problems:   Hypertension   Dementia   Falls   Physical deconditioning   Atrial fibrillation (HCC)   HLD (hyperlipidemia)   Underweight   Pressure injury of skin   Past Medical History:  Diagnosis Date  . Arthritis   . Dementia   . Depression   . Hyperlipidemia   . Hypertension   . Spinal stenosis     Surgeries: Procedure(s): L1 Kyphoplasty  and T-11 Kyphoplasty on 03/24/2017   Consultants (if any):   Discharged Condition: Improved  Hospital Course: Joshua Hammershilip C Saccente is an 83 y.o. male who was admitted 03/24/2017 with a diagnosis of Compression fracture of body of thoracic vertebra (HCC) and went to the operating room on 03/24/2017 and underwent the above named procedures.  Post op day 2 pt reports back pain.  Pt is urinating and had a bowel movement today.  Pt is still having significant lower extremity weakness but is mobilizing with assist with PT staff.   Pt is cleared to go to PT for Rehab.  We are hopeful with therapy he will continue to show improvement.   He was given perioperative antibiotics:  Anti-infectives    Start     Dose/Rate Route Frequency Ordered Stop   03/24/17 1700  ceFAZolin (ANCEF) IVPB 1 g/50 mL premix     1 g 100 mL/hr over 30 Minutes Intravenous Every 8 hours 03/24/17 1022 03/25/17 0149   03/24/17 0709  ceFAZolin (ANCEF) 2-4 GM/100ML-% IVPB    Comments:  [name], [name]   : cabinet override      03/24/17 0709 03/24/17 1914    .  He was given sequential compression devices, early ambulation, and TED for DVT prophylaxis.  He benefited maximally from the hospital stay and there were no complications.     Recent vital signs:  Vitals:   03/26/17 0601 03/26/17 0904  BP: 139/67 (!) 142/66  Pulse: (!) 45 62  Resp: 16 18  Temp: 97.9 F (36.6 C) 97.9 F (36.6 C)    Recent laboratory studies:  Lab Results  Component Value Date   HGB 13.8 03/19/2017   HGB 15.1 03/19/2017   HGB 10.5 (L) 07/11/2011   Lab Results  Component Value Date   WBC 5.7 03/19/2017   PLT 201 03/19/2017   Lab Results  Component Value Date   INR 1.06 07/08/2011   Lab Results  Component Value Date   NA 139 03/19/2017   K 3.9 03/19/2017   CL 102 03/19/2017   CO2 28 03/19/2017   BUN 18 03/19/2017   CREATININE 1.10 03/19/2017   GLUCOSE 101 (H) 03/19/2017    Discharge Medications:   Allergies as of 03/26/2017      Reactions   No Known Allergies       Medication List    STOP taking these medications   MIACALCIN 200 UNIT/ACT nasal spray Generic drug:  calcitonin (salmon)   traMADol 50 MG tablet Commonly known as:  ULTRAM     TAKE these medications   ALOE VERA PO Take 5,000 mg by mouth daily.   amLODipine 5 MG tablet Commonly known as:  NORVASC Take 5 mg by mouth daily.   aspirin 81 MG chewable tablet Chew  81 mg by mouth daily.   atorvastatin 20 MG tablet Commonly known as:  LIPITOR Take 20 mg by mouth daily with lunch.   donepezil 10 MG tablet Commonly known as:  ARICEPT Take 10 mg by mouth daily.   finasteride 5 MG tablet Commonly known as:  PROSCAR Take 5 mg by mouth daily.   HYDROcodone-acetaminophen 10-325 MG tablet Commonly known as:  NORCO Take 1 tablet by mouth every 4 (four) hours as needed for severe pain (post op kyphoplasty).   methocarbamol 500 MG tablet Commonly known as:  ROBAXIN Take 1 tablet (500 mg total) by mouth 3 (three) times daily.   multivitamin with minerals Tabs tablet Take 1 tablet by mouth daily. One a Day Men's Multivitamin   ondansetron 4 MG tablet Commonly known as:  ZOFRAN Take 1 tablet (4 mg total) by mouth daily as needed for nausea or  vomiting.   SOOTHE XP OP Place 1-2 drops into both eyes 3 (three) times daily as needed (for dry eyes.).   telmisartan-hydrochlorothiazide 80-12.5 MG tablet Commonly known as:  MICARDIS HCT Take 1 tablet by mouth daily.   VITAMIN D3 PO Take 1 tablet by mouth daily.       Diagnostic Studies: Dg Chest 2 View  Result Date: 03/19/2017 CLINICAL DATA:  Abnormal electrocardiogram.  Hypertension. EXAM: CHEST  2 VIEW COMPARISON:  July 08, 2011 FINDINGS: There is a minimal left pleural effusion. There is mild interstitial edema. There is no airspace consolidation. Heart is upper normal in size with pulmonary vascularity within normal limits. There is aortic atherosclerosis. No evident adenopathy. Bones are osteoporotic. There is degenerative change in the thoracic spine. IMPRESSION: Mild interstitial edema with minimal left pleural effusion. Suspect mild congestive heart failure. Heart upper normal in size. There is aortic atherosclerosis. Bones osteoporotic. Aortic Atherosclerosis (ICD10-I70.0). Electronically Signed   By: Bretta Bang III M.D.   On: 03/19/2017 14:41   Dg Thoracolumabar Spine  Result Date: 03/24/2017 CLINICAL DATA:  Compression fractures of T11 and L2. EXAM: DG C-ARM 61-120 MIN; THORACOLUMBAR SPINE - 2 VIEW COMPARISON:  Chest x-ray dated 03/21/2017 FINDINGS: AP and lateral C-arm images demonstrate the patient has undergone kyphoplasty at T11 and at L1. IMPRESSION: Kyphoplasty performed at T11 and L1. FLUOROSCOPY TIME:  2 minutes 53 seconds C-arm fluoroscopic images were obtained intraoperatively and submitted for post operative interpretation. Electronically Signed   By: Francene Boyers M.D.   On: 03/24/2017 10:58   Dg C-arm 1-60 Min  Result Date: 03/24/2017 CLINICAL DATA:  Compression fractures of T11 and L2. EXAM: DG C-ARM 61-120 MIN; THORACOLUMBAR SPINE - 2 VIEW COMPARISON:  Chest x-ray dated 03/21/2017 FINDINGS: AP and lateral C-arm images demonstrate the patient has  undergone kyphoplasty at T11 and at L1. IMPRESSION: Kyphoplasty performed at T11 and L1. FLUOROSCOPY TIME:  2 minutes 53 seconds C-arm fluoroscopic images were obtained intraoperatively and submitted for post operative interpretation. Electronically Signed   By: Francene Boyers M.D.   On: 03/24/2017 10:58    Disposition: Pt is discharged to a SNF for Rehab Pt will present to clinic for f/u and suture removal Post op medications provided for the pt  Discharge Instructions    Incentive spirometry RT    Complete by:  As directed          Signed: Kirt Boys 03/26/2017, 2:47 PM  Patient ID: Joshua Hammers, male   DOB: 12-31-1926, 83 y.o.   MRN: [id number]

## 2017-03-26 NOTE — Progress Notes (Signed)
Rounded on pt this afternoon.  He was completing PT.  He was noting some pain after such.  Pt still experiencing some weakness.  I was contacted by SW who informed me the pt was able to go to SNF for Rehab today.  I did consult with Dr. Shon BatonBrooks who was in agreement that the pt should benefit from such.  Post op medications and discharge orders/ instructions were provided for the SNF. I also consulted with his nurse.  Anette Riedelarmen Zaniel Marineau, [facility]

## 2017-03-26 NOTE — Discharge Instructions (Signed)
Balloon Kyphoplasty, Care After °Refer to this sheet in the next few weeks. These instructions provide you with information about caring for yourself after your procedure. Your health care provider may also give you more specific instructions. Your treatment has been planned according to current medical practices, but problems sometimes occur. Call your health care provider if you have any problems or questions after your procedure. °What can I expect after the procedure? °After your procedure, it is common to have back pain. °Follow these instructions at home: °Incision care °· Follow instructions from your health care provider about how to take care of your incisions. Make sure you: °? Wash your hands with soap and water before you change your bandage (dressing). If soap and water are not available, use hand sanitizer. °? Change your dressing as told by your health care provider. °? Leave stitches (sutures), skin glue, or adhesive strips in place. These skin closures may need to be in place for 2 weeks or longer. If adhesive strip edges start to loosen and curl up, you may trim the loose edges. Do not remove adhesive strips completely unless your health care provider tells you to do that. °· Check your incision area every day for signs of infection. Watch for: °? Redness, swelling, or pain. °? Fluid, blood, or pus. °· Keep your dressing dry until your health care provider says that it can be removed. °Activity ° °· Rest your back and avoid intense physical activity for as long as told by your health care provider. °· Return to your normal activities as told by your health care provider. Ask your health care provider what activities are safe for you. °· Do not lift anything that is heavier than 10 lb (4.5 kg). This is about the weight of a gallon of milk. You may need to avoid heavy lifting for several weeks. °General instructions °· Take over-the-counter and prescription medicines only as told by your health care  provider. °· If directed, apply ice to the painful area: °? Put ice in a plastic bag. °? Place a towel between your skin and the bag. °? Leave the ice on for 20 minutes, 2-3 times per day. °· Do not use tobacco products, including cigarettes, chewing tobacco, or e-cigarettes. If you need help quitting, ask your health care provider. °· Keep all follow-up visits as told by your health care provider. This is important. °Contact a health care provider if: °· You have a fever. °· You have redness, swelling, or pain at the site of your incisions. °· You have fluid, blood, or pus coming from your incisions. °· You have pain that gets worse or does not get better with medicine. °· You develop numbness or weakness in any part of your body. °Get help right away if: °· You have chest pain. °· You have difficulty breathing. °· You cannot move your legs. °· You cannot control your bladder or bowel movements. °· You suddenly become weak or numb on one side of your body. °· You become very confused. °· You have trouble speaking or understanding, or both. °This information is not intended to replace advice given to you by your health care provider. Make sure you discuss any questions you have with your health care provider. °Document Released: 05/22/2015 Document Revised: 02/06/2016 Document Reviewed: 12/24/2014 °Elsevier Interactive Patient Education © 2018 Elsevier Inc. ° °

## 2017-03-26 NOTE — NC FL2 (Signed)
[place] MEDICAID FL2 LEVEL OF CARE SCREENING TOOL     IDENTIFICATION  Patient Name: Joshua Bright Birthdate: 1926/09/16 Sex: male Admission Date (Current Location): 03/24/2017  [facility] and [place] Number:  [employer] and Address:  [facility] [place], [address] [place], [place] [address]      Provider Number: [id number]  Attending Physician Name and Address:  Venita Lick, MD  Relative Name and Phone Number:       Current Level of Care: Hospital Recommended Level of Care: Skilled Nursing Facility Prior Approval Number:    Date Approved/Denied:   PASRR Number: [id number]  Discharge Plan: SNF    Current Diagnoses: Patient Active Problem List   Diagnosis Date Noted  . Pressure injury of skin 03/25/2017  . Compression fracture of body of thoracic vertebra (HCC) 03/24/2017  . Hypertension 03/24/2017  . Dementia 03/24/2017  . Falls 03/24/2017  . Physical deconditioning 03/24/2017  . Atrial fibrillation (HCC) 03/24/2017  . HLD (hyperlipidemia) 03/24/2017  . Underweight 03/24/2017  . Essential hypertension 03/22/2017    Orientation RESPIRATION BLADDER Height & Weight     Self, Time, Situation, Place  Normal Incontinent Weight: 155 lb 1.6 oz (70.4 kg) Height:  5\' 8"  (172.7 cm)  BEHAVIORAL SYMPTOMS/MOOD NEUROLOGICAL BOWEL NUTRITION STATUS       (unknown)    AMBULATORY STATUS COMMUNICATION OF NEEDS Skin   Extensive Assist Verbally PU Stage and Appropriate Care, Surgical wounds   PU Stage 2 Dressing:  (unknown)                   Personal Care Assistance Level of Assistance  Bathing, Dressing Bathing Assistance: Maximum assistance   Dressing Assistance: Maximum assistance     Functional Limitations Info  Hearing   Hearing Info: Impaired      SPECIAL CARE FACTORS FREQUENCY  PT (By licensed PT), OT (By licensed OT)     PT Frequency: 5x/wk OT Frequency: 5x/wk            Contractures      Additional  Factors Info  Allergies, Code Status Code Status Info: full Allergies Info: nka           Current Medications (03/26/2017):  This is the current hospital active medication list Current Facility-Administered Medications  Medication Dose Route Frequency Provider Last Rate Last Dose  . 0.9 %  sodium chloride infusion  250 mL Intravenous Continuous Venita Lick, MD      . acetaminophen (TYLENOL) tablet 650 mg  650 mg Oral Q4H PRN Venita Lick, MD   650 mg at 03/26/17 5621   Or  . acetaminophen (TYLENOL) suppository 650 mg  650 mg Rectal Q4H PRN Venita Lick, MD      . amLODipine (NORVASC) tablet 5 mg  5 mg Oral Daily Venita Lick, MD   5 mg at 03/26/17 3086  . atorvastatin (LIPITOR) tablet 20 mg  20 mg Oral Q lunch Venita Lick, MD   20 mg at 03/26/17 1253  . docusate sodium (COLACE) capsule 100 mg  100 mg Oral BID Venita Lick, MD   100 mg at 03/26/17 5784  . donepezil (ARICEPT) tablet 10 mg  10 mg Oral Daily Venita Lick, MD   10 mg at 03/26/17 0937  . feeding supplement (ENSURE ENLIVE) (ENSURE ENLIVE) liquid 237 mL  237 mL Oral BID BM Russella Dar, NP   237 mL at 03/26/17 0940  . finasteride (PROSCAR) tablet 5 mg  5 mg Oral  Daily Venita LickBrooks, Dahari, MD   5 mg at 03/26/17 [id number]  . lactated ringers infusion   Intravenous Continuous Venita LickBrooks, Dahari, MD   Stopped at 03/25/17 1706  . menthol-cetylpyridinium (CEPACOL) lozenge 3 mg  1 lozenge Oral PRN Venita LickBrooks, Dahari, MD       Or  . phenol (CHLORASEPTIC) mouth spray 1 spray  1 spray Mouth/Throat PRN Venita LickBrooks, Dahari, MD      . methocarbamol (ROBAXIN) tablet 500 mg  500 mg Oral Q6H PRN Venita LickBrooks, Dahari, MD   500 mg at 03/26/17 [id number]   Or  . methocarbamol (ROBAXIN) 500 mg in dextrose 5 % 50 mL IVPB  500 mg Intravenous Q6H PRN Venita LickBrooks, Dahari, MD      . ondansetron [facility](ZOFRAN) tablet 4 mg  4 mg Oral Q6H PRN Venita LickBrooks, Dahari, MD       Or  . ondansetron [facility](ZOFRAN) injection 4 mg  4 mg Intravenous Q6H PRN Venita LickBrooks, Dahari, MD      . polyethylene glycol  (MIRALAX / GLYCOLAX) packet 17 g  17 g Oral Daily PRN Venita LickBrooks, Dahari, MD   17 g at 03/26/17 0147  . polyvinyl alcohol (LIQUIFILM TEARS) 1.4 % ophthalmic solution   Both Eyes TID PRN Venita LickBrooks, Dahari, MD      . senna [facility](SENOKOT) tablet 8.6 mg  1 tablet Oral BID Venita LickBrooks, Dahari, MD   8.6 mg at 03/26/17 [id number]  . sodium chloride flush (NS) 0.9 % injection 3 mL  3 mL Intravenous Q12H Venita LickBrooks, Dahari, MD   3 mL at 03/26/17 0940  . sodium chloride flush (NS) 0.9 % injection 3 mL  3 mL Intravenous PRN Venita LickBrooks, Dahari, MD         Discharge Medications: Please see discharge summary for a list of discharge medications.  Relevant Imaging Results:  Relevant Lab Results:   Additional Information SS#: [id number]  Baldemar LenisElizabeth M Sanav Remer, LCSW

## 2017-03-26 NOTE — Progress Notes (Signed)
    Subjective: Procedure(s) (LRB): L1 Kyphoplasty  and T-11 Kyphoplasty (N/A) 2 Days Post-Op  Patient reports pain as 3 on 0-10 scale.  Reports decreased leg pain reports incisional back pain   Positive void Positive bowel movement Positive flatus Negative chest pain or shortness of breath  Objective: Vital signs in last 24 hours: Temp:  [97.9 F (36.6 C)-99.1 F (37.3 C)] 97.9 F (36.6 C) (07/13 0601) Pulse Rate:  [45-64] 45 (07/13 0601) Resp:  [16-18] 16 (07/13 0601) BP: (131-145)/(67-81) 139/67 (07/13 0601) SpO2:  [94 %-98 %] 94 % (07/13 0601)  Intake/Output from previous day: 07/12 0701 - 07/13 0700 In: 1833.5 [P.O.:720; I.V.:1113.5] Out: 1000 [Urine:1000]  Labs: No results for input(s): WBC, RBC, HCT, PLT in the last 72 hours. No results for input(s): NA, K, CL, CO2, BUN, CREATININE, GLUCOSE, CALCIUM in the last 72 hours. No results for input(s): LABPT, INR in the last 72 hours.  Physical Exam: Neurologically intact Intact pulses distally Incision: dressing C/D/I Compartment soft  Assessment/Plan: Patient stable  xrays n/a Continue mobilization with physical therapy Continue care  Advance diet Up with therapy Discharge to SNF  Patient with improving exam.  Pain slowly improving Agree with condom cath as patient has generalized weakness and has diffficulty getting to bathroom No evidence of cauda equina syndrome - no saddle anesthesia, no incontinence of bowel Continue mobilization with therapy Await placement in SNF  Venita Lickahari Janaa Acero, MD [facility] [phone]

## 2017-03-26 NOTE — Progress Notes (Signed)
Patient is discharged from room 5C21 at this time. Alert and in stable condition. IV site d/c'd and instructions read to patient and family with understanding verbalized. Report given to receiving nurse Camillia HerterKatie Pulley, RN at [facility] with all questions answered. Left unit via stretcher by PTAR with wife and all belongings at side.

## 2017-03-26 NOTE — Progress Notes (Signed)
PT Progress Note Addendum for G-Codes    03/25/17 0805  PT G-Codes **NOT FOR INPATIENT CLASS**  Functional Assessment Tool Used AM-PAC 6 Clicks Basic Mobility;Clinical judgement  Functional Limitation Mobility: Walking and moving around  Mobility: Walking and Moving Around Current Status (Z6109(G8978) CM  Mobility: Walking and Moving Around Goal Status (U0454(G8979) CJ   Deborah ChalkJennifer Eivan Gallina, PT, DPT [phone]

## 2017-03-26 NOTE — Progress Notes (Signed)
Patient c/o need to have BM during night, standing orders for bowel meds added, gave prn miralax.  Minimal c/o pain except with any movement. Encouraged use of incentive spirometer. Continue to monitor patient.

## 2017-03-26 NOTE — Progress Notes (Signed)
Physical Therapy Treatment Patient Details Name: Joshua Bright MRN: [id number] DOB: 12-18-1926 Today's Date: 03/26/2017    History of Present Illness Pt is an 83 y/o male s/p T11 and L1 kyphoplasty. PMH including but not limited to dementia, HLD, HTN and spinal stenosis.    PT Comments    Pt seen for mobility progression; however, pt continues to be very limited secondary to pain and weakness. Pt attempted to perform sit<>stand from recliner chair x5 with two person assist, RW and the [place]. Pt was never able to fully achieve erect standing position. PT informed nursing staff to use total lift to return pt back to bed. Pt would continue to benefit from skilled physical therapy services at this time while admitted and after d/c to address the below listed limitations in order to improve overall safety and independence with functional mobility.    Follow Up Recommendations  SNF;Supervision/Assistance - 24 hour     Equipment Recommendations  None recommended by PT;Other (comment) (defer to next venue)    Recommendations for Other Services       Precautions / Restrictions Precautions Precautions: Fall;Back Precaution Booklet Issued: Yes (comment) Precaution Comments: pt requires constant reminders for postural correction and to maintain back precautions Required Braces or Orthoses:  ("no brace needed") Restrictions Weight Bearing Restrictions: No    Mobility  Bed Mobility               General bed mobility comments: pt sitting OOB in recliner chair upon arrival  Transfers Overall transfer level: Needs assistance Equipment used: Rolling walker (2 wheeled);2 person hand held assist Antony Salmon(Stedy) Transfers: Sit to/from Stand Sit to Stand: Total assist;+2 physical assistance;+2 safety/equipment         General transfer comment: attempted to stand from recliner chair x5 (twice with RW, once with 2 person HHA and twice with the [facility]); pt unable to achieve full, erect standing  position at this time  Ambulation/Gait                 Stairs            Wheelchair Mobility    Modified Rankin (Stroke Patients Only)       Balance Overall balance assessment: Needs assistance Sitting-balance support: Feet supported;Bilateral upper extremity supported Sitting balance-Leahy Scale: Poor     Standing balance support: During functional activity;Bilateral upper extremity supported Standing balance-Leahy Scale: Poor Standing balance comment: could not fully achieve erect standing position                            Cognition Arousal/Alertness: Awake/alert Behavior During Therapy: Anxious Overall Cognitive Status: Impaired/Different from baseline Area of Impairment: Memory;Following commands;Safety/judgement;Problem solving                     Memory: Decreased recall of precautions;Decreased short-term memory Following Commands: Follows one step commands inconsistently;Follows one step commands with increased time Safety/Judgement: Decreased awareness of safety;Decreased awareness of deficits   Problem Solving: Slow processing;Decreased initiation;Difficulty sequencing;Requires verbal cues;Requires tactile cues General Comments: no family/caregivers present, pt required multimodal cueing for functional mobility tasks      Exercises      General Comments        Pertinent Vitals/Pain Pain Assessment: Faces Faces Pain Scale: Hurts even more Pain Location: back Pain Descriptors / Indicators: Sore;Grimacing;Guarding;Moaning Pain Intervention(s): Monitored during session;Repositioned    Home Living  Prior Function            PT Goals (current goals can now be found in the care plan section) Acute Rehab PT Goals PT Goal Formulation: With patient Time For Goal Achievement: 04/08/17 Potential to Achieve Goals: Fair Progress towards PT goals: Not progressing toward goals - comment (limited  by pain and weakness)    Frequency    Min 5X/week      PT Plan Frequency needs to be updated    Co-evaluation              AM-PAC PT "6 Clicks" Daily Activity  Outcome Measure  Difficulty turning over in bed (including adjusting bedclothes, sheets and blankets)?: Total Difficulty moving from lying on back to sitting on the side of the bed? : Total Difficulty sitting down on and standing up from a chair with arms (e.g., wheelchair, bedside commode, etc,.)?: Total Help needed moving to and from a bed to chair (including a wheelchair)?: Total Help needed walking in hospital room?: Total Help needed climbing 3-5 steps with a railing? : Total 6 Click Score: 6    End of Session Equipment Utilized During Treatment: Gait belt Activity Tolerance: Patient limited by fatigue;Patient limited by pain Patient left: in chair;with call bell/phone within reach;with chair alarm set Nurse Communication: Mobility status;Need for lift equipment;Precautions PT Visit Diagnosis: Other abnormalities of gait and mobility (R26.89);Other (comment)     Time: 2130-8657 PT Time Calculation (min) (ACUTE ONLY): 12 min  Charges:  $Therapeutic Activity: 8-22 mins                    G Codes:       [place], [place], [place] [phone]    Alessandra Bevels Dorsie Sethi 03/26/2017, 3:46 PM

## 2017-03-26 NOTE — Progress Notes (Signed)
[facility] Anticipated discharge date: 03/26/17 Family notified: Yes, at bedside Transportation by: PTAR  Report #: [id number]  CSW signing off.  Blenda Nicelylizabeth Wane Mollett LCSW [phone]

## 2017-03-26 NOTE — Care Management Note (Signed)
Case Management Note  Patient Details  Name: Joshua Bright MRN: [id number] Date of Birth: 1926/11/14  Subjective/Objective:  Pt s/p  L1 Kyphoplasty  and T-11 Kyphoplasty. He is from home with his wife.                  Action/Plan: Plan is for SNF. CSW following. No further needs per CM.   Expected Discharge Date:                  Expected Discharge Plan:  Skilled Nursing Facility  In-House Referral:  Clinical Social Work  Discharge planning Services     Post Acute Care Choice:    Choice offered to:     DME Arranged:    DME Agency:     HH Arranged:    HH Agency:     Status of Service:  Completed, signed off  If discussed at [employer] Meetings, dates discussed:    Additional Comments:  Kermit BaloKelli F Chesnie Capell, RN 03/26/2017, 11:37 AM

## 2017-04-06 NOTE — Discharge Summary (Signed)
Physician Discharge Summary  Patient ID: Joshua Bright MRN: [id number] DOB/AGE: Nov 18, 1926 83 y.o.  Admit date: 03/24/2017 Discharge date: 03/26/17  Admission Diagnoses:  Compression fracture of body of thoracic vertebra [facility](HCC)  Discharge Diagnoses:  Principal Problem:   Compression fracture of body of thoracic vertebra (HCC) Active Problems:   Hypertension   Dementia   Falls   Physical deconditioning   Atrial fibrillation (HCC)   HLD (hyperlipidemia)   Underweight   Pressure injury of skin   Past Medical History:  Diagnosis Date  . Arthritis   . Dementia   . Depression   . Hyperlipidemia   . Hypertension   . Spinal stenosis     Surgeries: Procedure(s): L1 Kyphoplasty  and T-11 Kyphoplasty on 03/24/2017   Consultants (if any):   Discharged Condition: Improved  Hospital Course: Joshua Bright is an 83 y.o. male who was admitted 03/24/2017 with a diagnosis of Compression fracture of body of thoracic vertebra (HCC) and went to the operating room on 03/24/2017 and underwent the above named procedures.  Post op the pt was reporting decreased pain.  The pt was experiencing pain with ambulation with PT.  We did increase hi [place] medication which was helpful. Pt was still experiencing som LE weakness.  After consultation with the family and SW Rehab was decided on.  A SNF close to where the pt lives was selected.    He was given perioperative antibiotics:  Anti-infectives    Start     Dose/Rate Route Frequency Ordered Stop   03/24/17 1700  ceFAZolin (ANCEF) IVPB 1 g/50 mL premix     1 g 100 mL/hr over 30 Minutes Intravenous Every 8 hours 03/24/17 1022 03/25/17 0149   03/24/17 0709  ceFAZolin (ANCEF) 2-4 GM/100ML-% IVPB    Comments:  Joshua Bright   : cabinet override      03/24/17 0709 03/24/17 1914    .  He was given sequential compression devices, early ambulation, and TED for DVT prophylaxis.  He benefited maximally from the hospital stay and there were no  complications.    Recent vital signs:  Vitals:   03/26/17 0601 03/26/17 0904  BP: 139/67 (!) 142/66  Pulse: (!) 45 62  Resp: 16 18  Temp: 97.9 F (36.6 C) 97.9 F (36.6 C)    Recent laboratory studies:  Lab Results  Component Value Date   HGB 13.8 03/19/2017   HGB 15.1 03/19/2017   HGB 10.5 (L) 07/11/2011   Lab Results  Component Value Date   WBC 5.7 03/19/2017   PLT 201 03/19/2017   Lab Results  Component Value Date   INR 1.06 07/08/2011   Lab Results  Component Value Date   NA 139 03/19/2017   K 3.9 03/19/2017   CL 102 03/19/2017   CO2 28 03/19/2017   BUN 18 03/19/2017   CREATININE 1.10 03/19/2017   GLUCOSE 101 (H) 03/19/2017    Discharge Medications:   Allergies as of 03/26/2017      Reactions   No Known Allergies       Medication List    STOP taking these medications   MIACALCIN 200 UNIT/ACT nasal spray Generic drug:  calcitonin (salmon)   traMADol 50 MG tablet Commonly known as:  ULTRAM     TAKE these medications   ALOE VERA PO Take 5,000 mg by mouth daily.   amLODipine 5 MG tablet Commonly known as:  NORVASC Take 5 mg by mouth daily.   aspirin 81 MG chewable tablet  Chew 81 mg by mouth daily.   atorvastatin 20 MG tablet Commonly known as:  LIPITOR Take 20 mg by mouth daily with lunch.   donepezil 10 MG tablet Commonly known as:  ARICEPT Take 10 mg by mouth daily.   finasteride 5 MG tablet Commonly known as:  PROSCAR Take 5 mg by mouth daily.   HYDROcodone-acetaminophen 10-325 MG tablet Commonly known as:  NORCO Take 1 tablet by mouth every 4 (four) hours as needed for severe pain (post op kyphoplasty).   methocarbamol 500 MG tablet Commonly known as:  ROBAXIN Take 1 tablet (500 mg total) by mouth 3 (three) times daily.   multivitamin with minerals Tabs tablet Take 1 tablet by mouth daily. One a Day Men's Multivitamin   ondansetron 4 MG tablet Commonly known as:  ZOFRAN Take 1 tablet (4 mg total) by mouth daily as needed  for nausea or vomiting.   SOOTHE XP OP Place 1-2 drops into both eyes 3 (three) times daily as needed (for dry eyes.).   telmisartan-hydrochlorothiazide 80-12.5 MG tablet Commonly known as:  MICARDIS HCT Take 1 tablet by mouth daily.   VITAMIN D3 PO Take 1 tablet by mouth daily.       Diagnostic Studies: Dg Chest 2 View  Result Date: 03/19/2017 CLINICAL DATA:  Abnormal electrocardiogram.  Hypertension. EXAM: CHEST  2 VIEW COMPARISON:  July 08, 2011 FINDINGS: There is a minimal left pleural effusion. There is mild interstitial edema. There is no airspace consolidation. Heart is upper normal in size with pulmonary vascularity within normal limits. There is aortic atherosclerosis. No evident adenopathy. Bones are osteoporotic. There is degenerative change in the thoracic spine. IMPRESSION: Mild interstitial edema with minimal left pleural effusion. Suspect mild congestive heart failure. Heart upper normal in size. There is aortic atherosclerosis. Bones osteoporotic. Aortic Atherosclerosis (ICD10-I70.0). Electronically Signed   By: Bretta Bang III M.D.   On: 03/19/2017 14:41   Dg Thoracolumabar Spine  Result Date: 03/24/2017 CLINICAL DATA:  Compression fractures of T11 and L2. EXAM: DG C-ARM 61-120 MIN; THORACOLUMBAR SPINE - 2 VIEW COMPARISON:  Chest x-Joshua dated 03/21/2017 FINDINGS: AP and lateral C-arm images demonstrate the patient has undergone kyphoplasty at T11 and at L1. IMPRESSION: Kyphoplasty performed at T11 and L1. FLUOROSCOPY TIME:  2 minutes 53 seconds C-arm fluoroscopic images were obtained intraoperatively and submitted for post operative interpretation. Electronically Signed   By: Francene Boyers M.D.   On: 03/24/2017 10:58   Dg C-arm 1-60 Min  Result Date: 03/24/2017 CLINICAL DATA:  Compression fractures of T11 and L2. EXAM: DG C-ARM 61-120 MIN; THORACOLUMBAR SPINE - 2 VIEW COMPARISON:  Chest x-Joshua dated 03/21/2017 FINDINGS: AP and lateral C-arm images demonstrate the  patient has undergone kyphoplasty at T11 and at L1. IMPRESSION: Kyphoplasty performed at T11 and L1. FLUOROSCOPY TIME:  2 minutes 53 seconds C-arm fluoroscopic images were obtained intraoperatively and submitted for post operative interpretation. Electronically Signed   By: Francene Boyers M.D.   On: 03/24/2017 10:58    Disposition: 01-Home or Self Care Pt was discharged to a SNF  Pt wil;l present to clinic in 2 weeks Discharge order and scripts were provided  Discharge Instructions    Incentive spirometry RT    Complete by:  As directed       Contact information for after-discharge care    Destination    [facility] .   Specialty:  Skilled Nursing Facility Contact information: [address] [place] [address] [phone]  Signed: Kirt Boys 04/06/2017, 1:18 PM

## 2017-04-15 ENCOUNTER — Ambulatory Visit: Payer: Medicare Other | Admitting: Internal Medicine

## 2017-05-15 DEATH — deceased

## 2018-10-06 IMAGING — CR DG CHEST 2V
2 series · 2 of 2 positions shown · non-contrast
Comparison: July 08, 2011

CLINICAL DATA: Abnormal electrocardiogram.  Hypertension.

EXAM:
CHEST  2 VIEW

[chest lat]
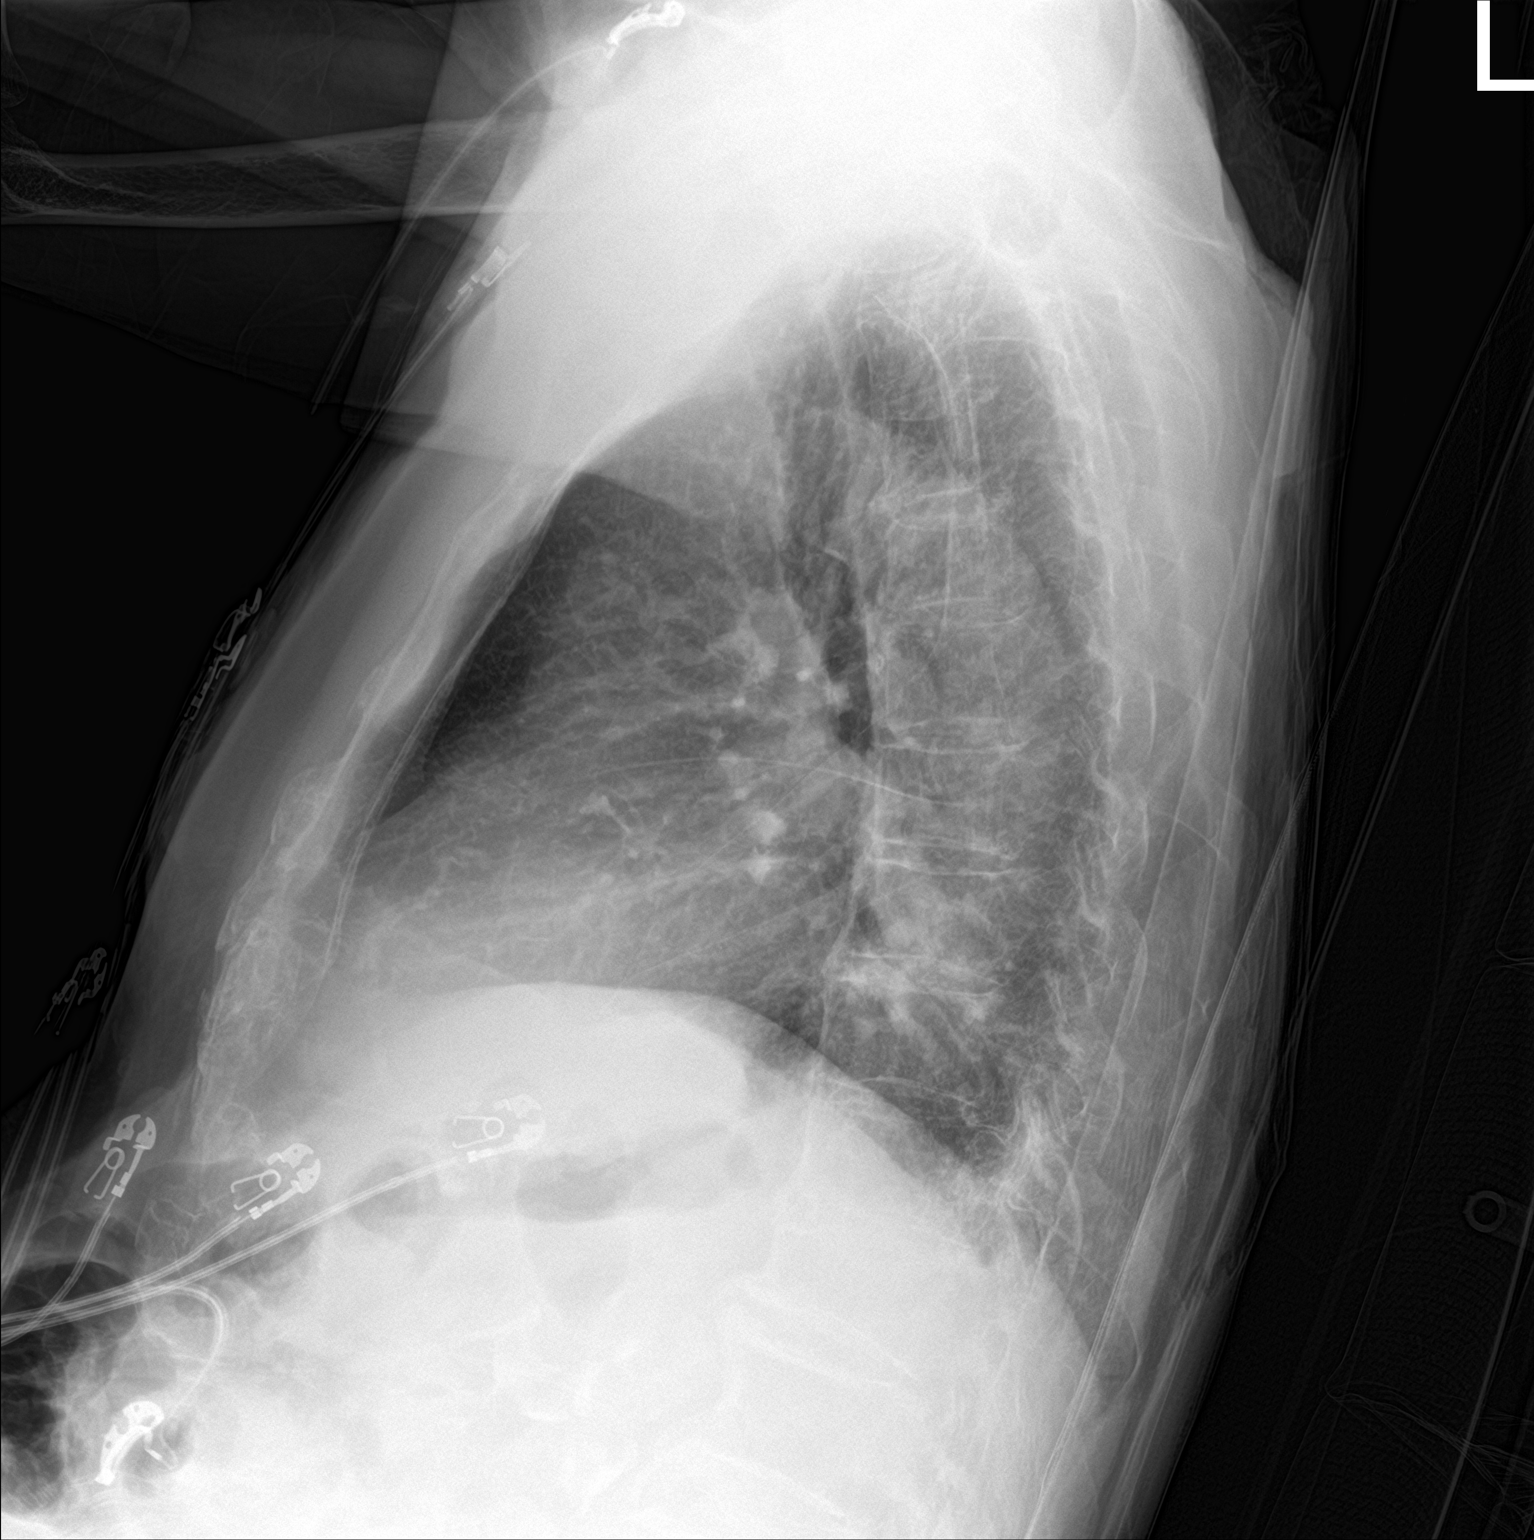

[chest ap]
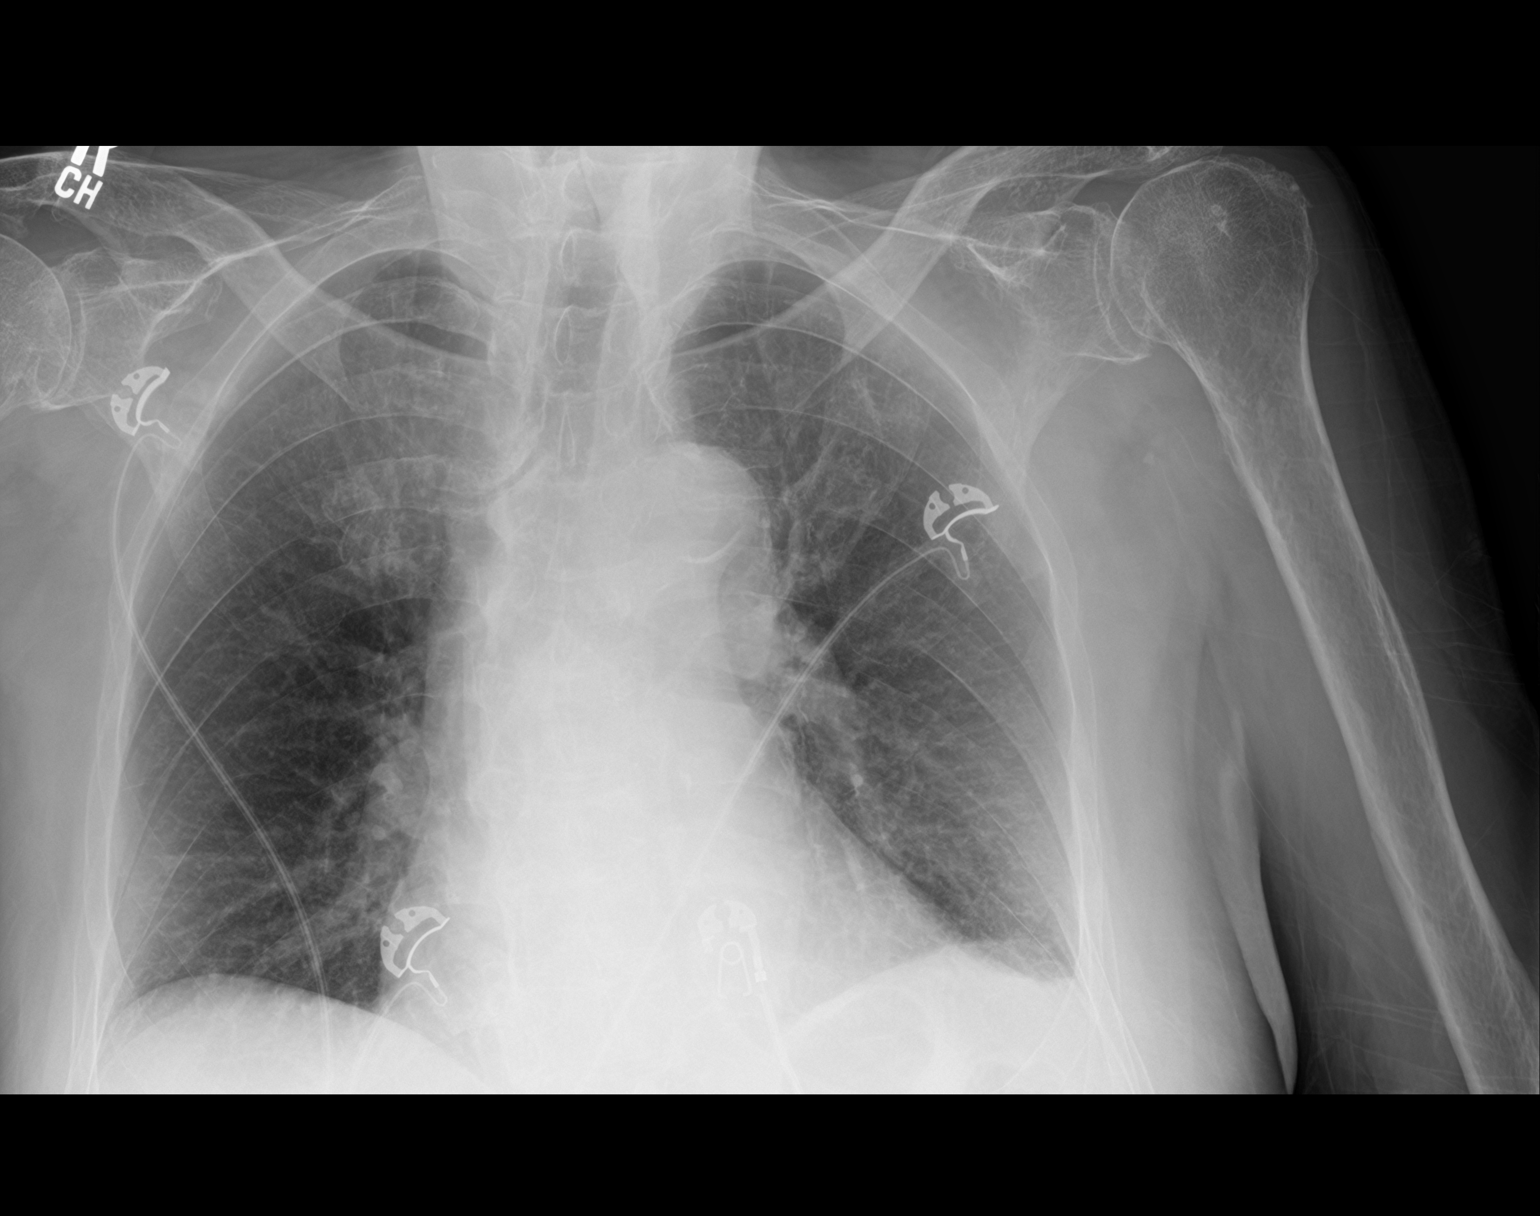

[2 of 2 positions shown; findings below may reference images not displayed]

FINDINGS: There is a minimal left pleural effusion. There is mild interstitial
edema. There is no airspace consolidation. Heart is upper normal in
size with pulmonary vascularity within normal limits. There is
aortic atherosclerosis.

No evident adenopathy. Bones are osteoporotic. There is degenerative
change in the thoracic spine.
IMPRESSION: Mild interstitial edema with minimal left pleural effusion. Suspect
mild congestive heart failure. Heart upper normal in size. There is
aortic atherosclerosis. Bones osteoporotic.

Aortic Atherosclerosis ([id number]).
